# Patient Record
Sex: Female | Born: 1950 | Race: Black or African American | Hispanic: No | Marital: Married | Smoking: Former smoker
Health system: Southern US, Community
[De-identification: ages and names within clinical notes are randomized; demographics above are authoritative.]

## PROBLEM LIST (undated history)

## (undated) DIAGNOSIS — D649 Anemia, unspecified: Secondary | ICD-10-CM

## (undated) DIAGNOSIS — M1990 Unspecified osteoarthritis, unspecified site: Secondary | ICD-10-CM

## (undated) DIAGNOSIS — A159 Respiratory tuberculosis unspecified: Secondary | ICD-10-CM

## (undated) DIAGNOSIS — R112 Nausea with vomiting, unspecified: Secondary | ICD-10-CM

## (undated) DIAGNOSIS — F32A Depression, unspecified: Secondary | ICD-10-CM

## (undated) DIAGNOSIS — F419 Anxiety disorder, unspecified: Secondary | ICD-10-CM

## (undated) DIAGNOSIS — Z9889 Other specified postprocedural states: Secondary | ICD-10-CM

## (undated) DIAGNOSIS — K219 Gastro-esophageal reflux disease without esophagitis: Secondary | ICD-10-CM

## (undated) DIAGNOSIS — I10 Essential (primary) hypertension: Secondary | ICD-10-CM

## (undated) HISTORY — PX: APPENDECTOMY: SHX54

## (undated) HISTORY — PX: WISDOM TOOTH EXTRACTION: SHX21

## (undated) HISTORY — PX: LAMINECTOMY: SHX219

## (undated) HISTORY — PX: ABDOMINAL HYSTERECTOMY: SHX81

## (undated) HISTORY — PX: TONSILLECTOMY: SUR1361

## (undated) HISTORY — PX: ROTATOR CUFF REPAIR: SHX139

---

## 2011-10-26 ENCOUNTER — Other Ambulatory Visit: Payer: Self-pay | Admitting: Orthopaedic Surgery

## 2011-10-26 DIAGNOSIS — M25511 Pain in right shoulder: Secondary | ICD-10-CM

## 2011-11-03 ENCOUNTER — Ambulatory Visit
Admission: RE | Admit: 2011-11-03 | Discharge: 2011-11-03 | Disposition: A | Discharge status: Home / Self Care | Payer: Medicare PPO | Source: Ambulatory Visit | Attending: Orthopaedic Surgery | Admitting: Orthopaedic Surgery

## 2011-11-03 DIAGNOSIS — M25511 Pain in right shoulder: Secondary | ICD-10-CM

## 2016-07-14 ENCOUNTER — Other Ambulatory Visit: Payer: Self-pay | Admitting: Orthopedic Surgery

## 2016-07-14 DIAGNOSIS — M542 Cervicalgia: Secondary | ICD-10-CM

## 2016-07-14 DIAGNOSIS — R200 Anesthesia of skin: Secondary | ICD-10-CM

## 2016-07-22 ENCOUNTER — Other Ambulatory Visit: Payer: Medicare PPO

## 2016-11-14 ENCOUNTER — Other Ambulatory Visit: Payer: Self-pay | Admitting: Physical Medicine and Rehabilitation

## 2016-11-14 DIAGNOSIS — M545 Low back pain: Secondary | ICD-10-CM

## 2016-12-02 ENCOUNTER — Ambulatory Visit
Admission: RE | Admit: 2016-12-02 | Discharge: 2016-12-02 | Disposition: A | Discharge status: Home / Self Care | Payer: Medicare PPO | Source: Ambulatory Visit | Attending: Physical Medicine and Rehabilitation | Admitting: Physical Medicine and Rehabilitation

## 2016-12-02 ENCOUNTER — Encounter: Payer: Self-pay | Admitting: Radiology

## 2016-12-02 DIAGNOSIS — M545 Low back pain: Secondary | ICD-10-CM

## 2021-04-23 NOTE — Progress Notes (Signed)
Sent message, via epic in basket, requesting orders in epic from surgeon.  

## 2021-04-30 NOTE — Progress Notes (Addendum)
COVID swab appointment: n/a  COVID Vaccine Completed: yes x3 Date COVID Vaccine completed: Has received booster: COVID vaccine manufacturer: Pfizer    [employer]   Date of COVID positive in last 90 days: no  PCP - Dr. Lorelei Pont, [facility] Cardiologist - n/a  Chest x-ray - n/a EKG - October or nov requested Stress Test - n/a ECHO - 2017, negative- Care Everywhere Cardiac Cath - n/a Pacemaker/ICD device last checked: n/a Spinal Cord Stimulator: n/a  Sleep Study - n/a CPAP -   Fasting Blood Sugar - n/a Checks Blood Sugar _____ times a day  Blood Thinner Instructions: n/a Aspirin Instructions: Last Dose:  Activity level: Can go up a flight of stairs and perform activities of daily living without stopping and without symptoms of chest pain or shortness of breath. No stairs and very limited walking due to right hip and knee       Anesthesia review:   Patient denies shortness of breath, fever, cough and chest pain at PAT appointment   Patient verbalized understanding of instructions that were given to them at the PAT appointment. Patient was also instructed that they will need to review over the PAT instructions again at home before surgery.

## 2021-04-30 NOTE — Patient Instructions (Addendum)
DUE TO COVID-19 ONLY ONE VISITOR IS ALLOWED TO COME WITH YOU AND STAY IN THE WAITING ROOM ONLY DURING PRE OP AND PROCEDURE.   **NO VISITORS ARE ALLOWED IN THE SHORT STAY AREA OR RECOVERY ROOM!!**       Your procedure is scheduled on: 05/14/21   Report to [facility] Main Entrance    Report to admitting at 7:30 AM   Call this number if you have problems the morning of surgery [phone]   Do not eat food :After Midnight.   May have liquids until 7:15 AM day of surgery  CLEAR LIQUID DIET  Foods Allowed                                                                     Foods Excluded  Water, Black Coffee and tea (no milk or creamer)           liquids that you cannot  Plain Jell-O in any flavor  (No red)                                    see through such as: Fruit ices (not with fruit pulp)                                            milk, soups, orange juice              Iced Popsicles (No red)                                                All solid food                                   Apple juices Sports drinks like Gatorade (No red) Lightly seasoned clear broth or consume(fat free) Sugar    Oral Hygiene is also important to reduce your risk of infection.                                    Remember - BRUSH YOUR TEETH THE MORNING OF SURGERY WITH YOUR REGULAR TOOTHPASTE   Take these medicines the morning of surgery with A SIP OF WATER: Tylenol, Amlodipine, Lipitor, Cymbalta, Nexium, Gabapentin, Claritin, Nebivolol  DO NOT TAKE ANY ORAL DIABETIC MEDICATIONS DAY OF YOUR SURGERY                              You may not have any metal on your body including hair pins, jewelry, and body piercing             Do not wear make-up, lotions, powders, perfumes, or deodorant  Do not wear nail polish including gel and S&S, artificial/acrylic nails, or any other type of covering  on natural nails including finger and toenails. If you have artificial nails, gel coating, etc. that  needs to be removed by a nail salon please have this removed prior to surgery or surgery may need to be canceled/ delayed if the surgeon/ anesthesia feels like they are unable to be safely monitored.   Do not shave  48 hours prior to surgery.    Do not bring valuables to the hospital. [facility] IS NOT             RESPONSIBLE   FOR VALUABLES.   Contacts, dentures or bridgework may not be worn into surgery.    Patients discharged on the day of surgery will not be allowed to drive home.   Special Instructions: Bring a copy of your healthcare power of attorney and living will documents         the day of surgery if you haven't scanned them before.              Please read over the following fact sheets you were given: IF YOU HAVE QUESTIONS ABOUT YOUR PRE-OP INSTRUCTIONS PLEASE CALL [phone]- [facility] - Preparing for Surgery Before surgery, you can play an important role.  Because skin is not sterile, your skin needs to be as free of germs as possible.  You can reduce the number of germs on your skin by washing with CHG (chlorahexidine gluconate) soap before surgery.  CHG is an antiseptic cleaner which kills germs and bonds with the skin to continue killing germs even after washing. Please DO NOT use if you have an allergy to CHG or antibacterial soaps.  If your skin becomes reddened/irritated stop using the CHG and inform your nurse when you arrive at Short Stay. Do not shave (including legs and underarms) for at least 48 hours prior to the first CHG shower.  You may shave your face/neck.  Please follow these instructions carefully:  1.  Shower with CHG Soap the night before surgery and the  morning of surgery.  2.  If you choose to wash your hair, wash your hair first as usual with your normal  shampoo.  3.  After you shampoo, rinse your hair and body thoroughly to remove the shampoo.                             4.  Use CHG as you would any other liquid soap.  You can apply chg  directly to the skin and wash.  Gently with a scrungie or clean washcloth.  5.  Apply the CHG Soap to your body ONLY FROM THE NECK DOWN.   Do   not use on face/ open                           Wound or open sores. Avoid contact with eyes, ears mouth and   genitals (private parts).                       Wash face,  Genitals (private parts) with your normal soap.             6.  Wash thoroughly, paying special attention to the area where your    surgery  will be performed.  7.  Thoroughly rinse your body with warm water from the neck down.  8.  DO NOT shower/wash with your normal soap  after using and rinsing off the CHG Soap.                9.  Pat yourself dry with a clean towel.            10.  Wear clean pajamas.            11.  Place clean sheets on your bed the night of your first shower and do not  sleep with pets. Day of Surgery : Do not apply any lotions/deodorants the morning of surgery.  Please wear clean clothes to the hospital/surgery center.  FAILURE TO FOLLOW THESE INSTRUCTIONS MAY RESULT IN THE CANCELLATION OF YOUR SURGERY  PATIENT SIGNATURE_________________________________  NURSE SIGNATURE__________________________________  ________________________________________________________________________

## 2021-05-03 ENCOUNTER — Other Ambulatory Visit: Payer: Self-pay

## 2021-05-03 ENCOUNTER — Encounter (HOSPITAL_COMMUNITY): Payer: Self-pay

## 2021-05-03 ENCOUNTER — Encounter (HOSPITAL_COMMUNITY)
Admission: RE | Admit: 2021-05-03 | Discharge: 2021-05-03 | Disposition: A | Discharge status: Home / Self Care | Payer: Medicare PPO | Source: Ambulatory Visit | Attending: Orthopedic Surgery | Admitting: Orthopedic Surgery

## 2021-05-03 ENCOUNTER — Ambulatory Visit: Payer: Self-pay | Admitting: Physician Assistant

## 2021-05-03 VITALS — BP 109/76 | HR 87 | Temp 98.4°F | Resp 18 | Ht 71.0 in | Wt 228.0 lb

## 2021-05-03 DIAGNOSIS — M1611 Unilateral primary osteoarthritis, right hip: Secondary | ICD-10-CM | POA: Insufficient documentation

## 2021-05-03 DIAGNOSIS — M25552 Pain in left hip: Secondary | ICD-10-CM

## 2021-05-03 DIAGNOSIS — G8929 Other chronic pain: Secondary | ICD-10-CM | POA: Insufficient documentation

## 2021-05-03 DIAGNOSIS — M25551 Pain in right hip: Secondary | ICD-10-CM

## 2021-05-03 DIAGNOSIS — I2510 Atherosclerotic heart disease of native coronary artery without angina pectoris: Secondary | ICD-10-CM | POA: Insufficient documentation

## 2021-05-03 DIAGNOSIS — Z01812 Encounter for preprocedural laboratory examination: Secondary | ICD-10-CM | POA: Insufficient documentation

## 2021-05-03 DIAGNOSIS — Z01818 Encounter for other preprocedural examination: Secondary | ICD-10-CM

## 2021-05-03 DIAGNOSIS — M1612 Unilateral primary osteoarthritis, left hip: Secondary | ICD-10-CM

## 2021-05-03 HISTORY — DX: Respiratory tuberculosis unspecified: A15.9

## 2021-05-03 HISTORY — DX: Depression, unspecified: F32.A

## 2021-05-03 HISTORY — DX: Essential (primary) hypertension: I10

## 2021-05-03 HISTORY — DX: Anxiety disorder, unspecified: F41.9

## 2021-05-03 HISTORY — DX: Gastro-esophageal reflux disease without esophagitis: K21.9

## 2021-05-03 HISTORY — DX: Anemia, unspecified: D64.9

## 2021-05-03 HISTORY — DX: Unspecified osteoarthritis, unspecified site: M19.90

## 2021-05-03 LAB — CBC
HCT: 35.6 % — ABNORMAL LOW (ref 36.0–46.0)
Hemoglobin: 11.5 g/dL — ABNORMAL LOW (ref 12.0–15.0)
MCH: 28.4 pg (ref 26.0–34.0)
MCHC: 32.3 g/dL (ref 30.0–36.0)
MCV: 87.9 fL (ref 80.0–100.0)
Platelets: 432 10*3/uL — ABNORMAL HIGH (ref 150–400)
RBC: 4.05 MIL/uL (ref 3.87–5.11)
RDW: 14.6 % (ref 11.5–15.5)
WBC: 9.4 10*3/uL (ref 4.0–10.5)
nRBC: 0 % (ref 0.0–0.2)

## 2021-05-03 LAB — BASIC METABOLIC PANEL
Anion gap: 13 (ref 5–15)
BUN: 14 mg/dL (ref 8–23)
CO2: 28 mmol/L (ref 22–32)
Calcium: 8.8 mg/dL — ABNORMAL LOW (ref 8.9–10.3)
Chloride: 98 mmol/L (ref 98–111)
Creatinine, Ser: 0.81 mg/dL (ref 0.44–1.00)
GFR, Estimated: >60 mL/min (ref >60)
Glucose, Bld: 109 mg/dL — ABNORMAL HIGH (ref 70–99)
Potassium: 3.1 mmol/L — ABNORMAL LOW (ref 3.5–5.1)
Sodium: 139 mmol/L (ref 135–145)

## 2021-05-03 LAB — SURGICAL PCR SCREEN
MRSA, PCR: NEGATIVE
Staphylococcus aureus: POSITIVE — AB

## 2021-05-03 NOTE — H&P (Signed)
TOTAL HIP ADMISSION H&P ° °Patient is admitted for right total hip arthroplasty. ° °Subjective: ° °Chief Complaint: right hip pain ° °HPI: Jeanette Bennett, 70 y.o. female, has a history of pain and functional disability in the right hip(s) due to arthritis and patient has failed non-surgical conservative treatments for greater than 12 weeks to include NSAID's and/or analgesics, corticosteriod injections, and activity modification.  Onset of symptoms was gradual starting 5 years ago with gradually worsening course since that time.The patient noted no past surgery on the right hip(s).  Patient currently rates pain in the right hip at 8 out of 10 with activity. Patient has night pain, worsening of pain with activity and weight bearing, trendelenberg gait, pain that interfers with activities of daily living, and pain with passive range of motion. Patient has evidence of periarticular osteophytes and joint space narrowing by imaging studies. This condition presents safety issues increasing the risk of falls. This patient has had    .  There is no current active infection. ° °Patient Active Problem List  ° Diagnosis Date Noted  ° Chronic right hip pain 05/03/2021  ° Primary localized osteoarthritis of right hip 05/03/2021  ° °Past Medical History:  °Diagnosis Date  ° Anemia   ° Anxiety   ° Arthritis   ° Depression   ° GERD (gastroesophageal reflux disease)   ° Hypertension   ° Tuberculosis   °  °Past Surgical History:  °Procedure Laterality Date  ° ABDOMINAL HYSTERECTOMY    ° APPENDECTOMY    ° LAMINECTOMY    ° ROTATOR CUFF REPAIR Right   ° TONSILLECTOMY    ° WISDOM TOOTH EXTRACTION    °  °Current Outpatient Medications  °Medication Sig Dispense Refill Last Dose  ° acetaminophen (TYLENOL) 650 MG CR tablet Take 1,300 mg by mouth every 8 (eight) hours as needed for pain.     ° alendronate (FOSAMAX) 70 MG tablet Take 70 mg by mouth every Monday. Take with a full glass of water on an empty stomach.     ° amLODipine  (NORVASC) 10 MG tablet Take 10 mg by mouth daily.     ° atorvastatin (LIPITOR) 80 MG tablet Take 40 mg by mouth daily.     ° calcium carbonate (TUMS - DOSED IN MG ELEMENTAL CALCIUM) 500 MG chewable tablet Chew 1,500 mg by mouth 3 (three) times daily as needed for indigestion or heartburn.     ° colchicine 0.6 MG tablet Take 0.6-1.2 mg by mouth See admin instructions. Take 1.2 mg at onset of gout flare then take 0.6 mg 1 hour later as needed for gout     ° diclofenac Sodium (VOLTAREN) 1 % GEL Apply 1 application topically 4 (four) times daily.     ° DULoxetine (CYMBALTA) 30 MG capsule Take 30 mg by mouth daily.     ° esomeprazole (NEXIUM) 40 MG capsule Take 40 mg by mouth daily.     ° gabapentin (NEURONTIN) 100 MG capsule Take 100 mg by mouth 2 (two) times daily.     ° loratadine (CLARITIN) 10 MG tablet Take 10 mg by mouth daily.     ° Multiple Vitamin (MULTIVITAMIN WITH MINERALS) TABS tablet Take 1 tablet by mouth daily.     ° Nebivolol HCl 20 MG TABS Take 20 mg by mouth daily.     ° potassium chloride (KLOR-CON M) 10 MEQ tablet Take 10 mEq by mouth daily.     ° triamterene-hydrochlorothiazide (MAXZIDE) 75-50 MG tablet Take 1 tablet   by mouth daily.     ° valsartan (DIOVAN) 160 MG tablet Take 160 mg by mouth daily.     ° Vitamin D, Ergocalciferol, (DRISDOL) 1.25 MG (50000 UNIT) CAPS capsule Take 50,000 Units by mouth every Monday.     ° °No current facility-administered medications for this visit.  ° °Allergies  °Allergen Reactions  ° Other Shortness Of Breath  °  Tree Nuts  ° Sulfa Antibiotics Nausea And Vomiting and Rash  °  °Social History  ° °Tobacco Use  ° Smoking status: Former  °  Types: Cigarettes  ° Smokeless tobacco: Never  °Substance Use Topics  ° Alcohol use: Yes  °  Comment: occ  °  °No family history on file.  ° °Review of Systems  °Respiratory:  Positive for cough.   °Gastrointestinal:  Positive for nausea and vomiting.  °Genitourinary:  Positive for frequency.  °Musculoskeletal:  Positive for  arthralgias.  °Hematological:  Bruises/bleeds easily.  °All other systems reviewed and are negative. ° °Objective: ° °Physical Exam °Constitutional:   °   General: She is not in acute distress. °   Appearance: Normal appearance.  °HENT:  °   Head: Normocephalic and atraumatic.  °Eyes:  °   Extraocular Movements: Extraocular movements intact.  °   Pupils: Pupils are equal, round, and reactive to light.  °Cardiovascular:  °   Rate and Rhythm: Regular rhythm. Tachycardia present.  °   Pulses: Normal pulses.  °   Heart sounds: Normal heart sounds.  °Pulmonary:  °   Effort: Pulmonary effort is normal. No respiratory distress.  °   Breath sounds: Normal breath sounds.  °Abdominal:  °   General: Abdomen is flat. Bowel sounds are normal. There is no distension.  °   Palpations: Abdomen is soft.  °   Tenderness: There is no abdominal tenderness.  °Musculoskeletal:  °   Cervical back: Normal range of motion and neck supple.  °   Comments: Examination of the right lower extremity again shows she is neurovascularly intact.  Intact dorsiflexion and plantarflexion with the ankle.  There is no calf tenderness to palpation.  Painful arc of motion and limited range of motion with the right hip passively.    °Lymphadenopathy:  °   Cervical: No cervical adenopathy.  °Skin: °   General: Skin is warm and dry.  °   Findings: No erythema or rash.  °Neurological:  °   General: No focal deficit present.  °   Mental Status: She is alert and oriented to person, place, and time.  °Psychiatric:     °   Mood and Affect: Mood normal.     °   Behavior: Behavior normal.  ° ° °Vital signs in last 24 hours: °@VSRANGES@ ° °Labs: ° ° °Estimated body mass index is 31.8 kg/m² as calculated from the following: °  Height as of an earlier encounter on 05/03/21: 5' 11" (1.803 m). °  Weight as of an earlier encounter on 05/03/21: 103.4 kg. ° ° °Imaging Review °Plain radiographs demonstrate moderate degenerative joint disease of the right hip(s). The bone  quality appears to be good for age and reported activity level. ° ° ° ° ° °Assessment/Plan: ° °End stage arthritis, right hip(s) ° °The patient history, physical examination, clinical judgement of the provider and imaging studies are consistent with end stage degenerative joint disease of the right hip(s) and total hip arthroplasty is deemed medically necessary. The treatment options including medical management, injection therapy, arthroscopy and   arthroplasty were discussed at length. The risks and benefits of total hip arthroplasty were presented and reviewed. The risks due to aseptic loosening, infection, stiffness, dislocation/subluxation,  thromboembolic complications and other imponderables were discussed.  The patient acknowledged the explanation, agreed to proceed with the plan and consent was signed. Patient is being admitted for inpatient treatment for surgery, pain control, PT, OT, prophylactic antibiotics, VTE prophylaxis, progressive ambulation and ADL's and discharge planning.The patient is planning to be discharged  home with outpt PT. ° ° °Anticipated LOS equal to or greater than 2 midnights due to °- Age 65 and older with one or more of the following: ° - Obesity ° - Expected need for hospital services (PT, OT, Nursing) required for safe  discharge ° - Anticipated need for postoperative skilled nursing care or inpatient rehab ° - Active co-morbidities: Diabetes °OR  ° °- Unanticipated findings during/Post Surgery: None  °- Patient is a high risk of re-admission due to: None °

## 2021-05-03 NOTE — H&P (View-Only) (Signed)
TOTAL HIP ADMISSION H&P  Patient is admitted for right total hip arthroplasty.  Subjective:  Chief Complaint: right hip pain  HPI: Jeanette Bennett [facility], 70 y.o. female, has a history of pain and functional disability in the right hip(s) due to arthritis and patient has failed non-surgical conservative treatments for greater than 12 weeks to include NSAID's and/or analgesics, corticosteriod injections, and activity modification.  Onset of symptoms was gradual starting 5 years ago with gradually worsening course since that time.The patient noted no past surgery on the right hip(s).  Patient currently rates pain in the right hip at 8 out of 10 with activity. Patient has night pain, worsening of pain with activity and weight bearing, trendelenberg gait, pain that interfers with activities of daily living, and pain with passive range of motion. Patient has evidence of periarticular osteophytes and joint space narrowing by imaging studies. This condition presents safety issues increasing the risk of falls. This patient has had    .  There is no current active infection.  Patient Active Problem List   Diagnosis Date Noted   Chronic right hip pain 05/03/2021   Primary localized osteoarthritis of right hip 05/03/2021   Past Medical History:  Diagnosis Date   Anemia    Anxiety    Arthritis    Depression    GERD (gastroesophageal reflux disease)    Hypertension    Tuberculosis     Past Surgical History:  Procedure Laterality Date   ABDOMINAL HYSTERECTOMY     APPENDECTOMY     LAMINECTOMY     ROTATOR CUFF REPAIR Right    TONSILLECTOMY     WISDOM TOOTH EXTRACTION      Current Outpatient Medications  Medication Sig Dispense Refill Last Dose   acetaminophen (TYLENOL) 650 MG CR tablet Take 1,300 mg by mouth every 8 (eight) hours as needed for pain.      alendronate (FOSAMAX) 70 MG tablet Take 70 mg by mouth every Monday. Take with a full glass of water on an empty stomach.      amLODipine  (NORVASC) 10 MG tablet Take 10 mg by mouth daily.      atorvastatin (LIPITOR) 80 MG tablet Take 40 mg by mouth daily.      calcium carbonate (TUMS - DOSED IN MG ELEMENTAL CALCIUM) 500 MG chewable tablet Chew 1,500 mg by mouth 3 (three) times daily as needed for indigestion or heartburn.      colchicine 0.6 MG tablet Take 0.6-1.2 mg by mouth See admin instructions. Take 1.2 mg at onset of gout flare then take 0.6 mg 1 hour later as needed for gout      diclofenac Sodium (VOLTAREN) 1 % GEL Apply 1 application topically 4 (four) times daily.      DULoxetine (CYMBALTA) 30 MG capsule Take 30 mg by mouth daily.      esomeprazole (NEXIUM) 40 MG capsule Take 40 mg by mouth daily.      gabapentin (NEURONTIN) 100 MG capsule Take 100 mg by mouth 2 (two) times daily.      loratadine (CLARITIN) 10 MG tablet Take 10 mg by mouth daily.      Multiple Vitamin (MULTIVITAMIN WITH MINERALS) TABS tablet Take 1 tablet by mouth daily.      Nebivolol HCl 20 MG TABS Take 20 mg by mouth daily.      potassium chloride (KLOR-CON M) 10 MEQ tablet Take 10 mEq by mouth daily.      triamterene-hydrochlorothiazide (MAXZIDE) 75-50 MG tablet Take 1 tablet  by mouth daily.      valsartan (DIOVAN) 160 MG tablet Take 160 mg by mouth daily.      Vitamin D, Ergocalciferol, (DRISDOL) 1.25 MG (50000 UNIT) CAPS capsule Take 50,000 Units by mouth every Monday.      No current facility-administered medications for this visit.   Allergies  Allergen Reactions   Other Shortness Of Breath    Tree Nuts   Sulfa Antibiotics Nausea And Vomiting and Rash    Social History   Tobacco Use   Smoking status: Former    Types: Cigarettes   Smokeless tobacco: Never  Substance Use Topics   Alcohol use: Yes    Comment: occ    No family history on file.   Review of Systems  Respiratory:  Positive for cough.   Gastrointestinal:  Positive for nausea and vomiting.  Genitourinary:  Positive for frequency.  Musculoskeletal:  Positive for  arthralgias.  Hematological:  Bruises/bleeds easily.  All other systems reviewed and are negative.  Objective:  Physical Exam Constitutional:      General: She is not in acute distress.    Appearance: Normal appearance.  HENT:     Head: Normocephalic and atraumatic.  Eyes:     Extraocular Movements: Extraocular movements intact.     Pupils: Pupils are equal, round, and reactive to light.  Cardiovascular:     Rate and Rhythm: Regular rhythm. Tachycardia present.     Pulses: Normal pulses.     Heart sounds: Normal heart sounds.  Pulmonary:     Effort: Pulmonary effort is normal. No respiratory distress.     Breath sounds: Normal breath sounds.  Abdominal:     General: Abdomen is flat. Bowel sounds are normal. There is no distension.     Palpations: Abdomen is soft.     Tenderness: There is no abdominal tenderness.  Musculoskeletal:     Cervical back: Normal range of motion and neck supple.     Comments: Examination of the right lower extremity again shows she is neurovascularly intact.  Intact dorsiflexion and plantarflexion with the ankle.  There is no calf tenderness to palpation.  Painful arc of motion and limited range of motion with the right hip passively.    Lymphadenopathy:     Cervical: No cervical adenopathy.  Skin:    General: Skin is warm and dry.     Findings: No erythema or rash.  Neurological:     General: No focal deficit present.     Mental Status: She is alert and oriented to person, place, and time.  Psychiatric:        Mood and Affect: Mood normal.        Behavior: Behavior normal.    Vital signs in last 24 hours: @VSRANGES @  Labs:   Estimated body mass index is 31.8 kg/m as calculated from the following:   Height as of an earlier encounter on 05/03/21: 5\' 11"  (1.803 m).   Weight as of an earlier encounter on 05/03/21: 103.4 kg.   Imaging Review Plain radiographs demonstrate moderate degenerative joint disease of the right hip(s). The bone  quality appears to be good for age and reported activity level.      Assessment/Plan:  End stage arthritis, right hip(s)  The patient history, physical examination, clinical judgement of the provider and imaging studies are consistent with end stage degenerative joint disease of the right hip(s) and total hip arthroplasty is deemed medically necessary. The treatment options including medical management, injection therapy, arthroscopy and  arthroplasty were discussed at length. The risks and benefits of total hip arthroplasty were presented and reviewed. The risks due to aseptic loosening, infection, stiffness, dislocation/subluxation,  thromboembolic complications and other imponderables were discussed.  The patient acknowledged the explanation, agreed to proceed with the plan and consent was signed. Patient is being admitted for inpatient treatment for surgery, pain control, PT, OT, prophylactic antibiotics, VTE prophylaxis, progressive ambulation and ADL's and discharge planning.The patient is planning to be discharged  home with outpt PT.   Anticipated LOS equal to or greater than 2 midnights due to - Age 2 and older with one or more of the following:  - Obesity  - Expected need for hospital services (PT, OT, Nursing) required for safe  discharge  - Anticipated need for postoperative skilled nursing care or inpatient rehab  - Active co-morbidities: Diabetes OR   - Unanticipated findings during/Post Surgery: None  - Patient is a high risk of re-admission due to: None

## 2021-05-14 ENCOUNTER — Ambulatory Visit (HOSPITAL_COMMUNITY): Payer: Medicare PPO

## 2021-05-14 ENCOUNTER — Encounter (HOSPITAL_COMMUNITY): Payer: Self-pay | Admitting: Orthopedic Surgery

## 2021-05-14 ENCOUNTER — Encounter (HOSPITAL_COMMUNITY)
Admission: RE | Disposition: A | Discharge status: Skilled Nursing Facility | Payer: Self-pay | Source: Home / Self Care | Attending: Internal Medicine

## 2021-05-14 ENCOUNTER — Inpatient Hospital Stay (HOSPITAL_COMMUNITY)
Admission: RE | Admit: 2021-05-14 | Discharge: 2021-05-23 | DRG: 469 | Disposition: A | Discharge status: Skilled Nursing Facility | Payer: Medicare PPO | Attending: Internal Medicine | Admitting: Internal Medicine

## 2021-05-14 ENCOUNTER — Ambulatory Visit (HOSPITAL_COMMUNITY): Payer: Medicare PPO | Admitting: Anesthesiology

## 2021-05-14 ENCOUNTER — Other Ambulatory Visit: Payer: Self-pay

## 2021-05-14 DIAGNOSIS — M109 Gout, unspecified: Secondary | ICD-10-CM | POA: Diagnosis present

## 2021-05-14 DIAGNOSIS — I9581 Postprocedural hypotension: Secondary | ICD-10-CM | POA: Diagnosis not present

## 2021-05-14 DIAGNOSIS — Z6831 Body mass index (BMI) 31.0-31.9, adult: Secondary | ICD-10-CM

## 2021-05-14 DIAGNOSIS — E785 Hyperlipidemia, unspecified: Secondary | ICD-10-CM | POA: Diagnosis present

## 2021-05-14 DIAGNOSIS — Z7983 Long term (current) use of bisphosphonates: Secondary | ICD-10-CM

## 2021-05-14 DIAGNOSIS — E119 Type 2 diabetes mellitus without complications: Secondary | ICD-10-CM | POA: Diagnosis present

## 2021-05-14 DIAGNOSIS — G9340 Encephalopathy, unspecified: Secondary | ICD-10-CM | POA: Diagnosis present

## 2021-05-14 DIAGNOSIS — M1611 Unilateral primary osteoarthritis, right hip: Principal | ICD-10-CM | POA: Diagnosis present

## 2021-05-14 DIAGNOSIS — Z8611 Personal history of tuberculosis: Secondary | ICD-10-CM

## 2021-05-14 DIAGNOSIS — F32A Depression, unspecified: Secondary | ICD-10-CM | POA: Diagnosis present

## 2021-05-14 DIAGNOSIS — K219 Gastro-esophageal reflux disease without esophagitis: Secondary | ICD-10-CM | POA: Diagnosis present

## 2021-05-14 DIAGNOSIS — J9601 Acute respiratory failure with hypoxia: Secondary | ICD-10-CM | POA: Diagnosis not present

## 2021-05-14 DIAGNOSIS — Z90710 Acquired absence of both cervix and uterus: Secondary | ICD-10-CM

## 2021-05-14 DIAGNOSIS — S01512A Laceration without foreign body of oral cavity, initial encounter: Secondary | ICD-10-CM | POA: Diagnosis not present

## 2021-05-14 DIAGNOSIS — G4733 Obstructive sleep apnea (adult) (pediatric): Secondary | ICD-10-CM | POA: Diagnosis present

## 2021-05-14 DIAGNOSIS — T40605A Adverse effect of unspecified narcotics, initial encounter: Secondary | ICD-10-CM | POA: Diagnosis not present

## 2021-05-14 DIAGNOSIS — Z7982 Long term (current) use of aspirin: Secondary | ICD-10-CM

## 2021-05-14 DIAGNOSIS — K5900 Constipation, unspecified: Secondary | ICD-10-CM | POA: Diagnosis not present

## 2021-05-14 DIAGNOSIS — Z9109 Other allergy status, other than to drugs and biological substances: Secondary | ICD-10-CM

## 2021-05-14 DIAGNOSIS — I10 Essential (primary) hypertension: Secondary | ICD-10-CM | POA: Diagnosis present

## 2021-05-14 DIAGNOSIS — G8929 Other chronic pain: Secondary | ICD-10-CM | POA: Diagnosis present

## 2021-05-14 DIAGNOSIS — Z993 Dependence on wheelchair: Secondary | ICD-10-CM

## 2021-05-14 DIAGNOSIS — I700 Atherosclerosis of aorta: Secondary | ICD-10-CM | POA: Diagnosis present

## 2021-05-14 DIAGNOSIS — R7401 Elevation of levels of liver transaminase levels: Secondary | ICD-10-CM | POA: Diagnosis not present

## 2021-05-14 DIAGNOSIS — Z882 Allergy status to sulfonamides status: Secondary | ICD-10-CM

## 2021-05-14 DIAGNOSIS — M25551 Pain in right hip: Secondary | ICD-10-CM

## 2021-05-14 DIAGNOSIS — Z9181 History of falling: Secondary | ICD-10-CM

## 2021-05-14 DIAGNOSIS — E669 Obesity, unspecified: Secondary | ICD-10-CM | POA: Diagnosis present

## 2021-05-14 DIAGNOSIS — Z79899 Other long term (current) drug therapy: Secondary | ICD-10-CM

## 2021-05-14 DIAGNOSIS — F419 Anxiety disorder, unspecified: Secondary | ICD-10-CM | POA: Diagnosis present

## 2021-05-14 DIAGNOSIS — R4701 Aphasia: Secondary | ICD-10-CM | POA: Diagnosis not present

## 2021-05-14 DIAGNOSIS — Z87891 Personal history of nicotine dependence: Secondary | ICD-10-CM

## 2021-05-14 DIAGNOSIS — Z91018 Allergy to other foods: Secondary | ICD-10-CM

## 2021-05-14 DIAGNOSIS — G9341 Metabolic encephalopathy: Secondary | ICD-10-CM | POA: Diagnosis not present

## 2021-05-14 DIAGNOSIS — D62 Acute posthemorrhagic anemia: Secondary | ICD-10-CM | POA: Diagnosis not present

## 2021-05-14 DIAGNOSIS — Z20822 Contact with and (suspected) exposure to covid-19: Secondary | ICD-10-CM | POA: Diagnosis present

## 2021-05-14 HISTORY — PX: TOTAL HIP ARTHROPLASTY: SHX124

## 2021-05-14 LAB — ABO/RH: ABO/RH(D): A POS

## 2021-05-14 LAB — TYPE AND SCREEN
ABO/RH(D): A POS
Antibody Screen: NEGATIVE

## 2021-05-14 SURGERY — ARTHROPLASTY, HIP, TOTAL,POSTERIOR APPROACH
Anesthesia: Spinal | Site: Hip | Laterality: Right

## 2021-05-14 MED ORDER — OXYCODONE HCL 5 MG PO TABS
ORAL_TABLET | ORAL | Status: AC
  Filled 2021-05-14: qty 2

## 2021-05-14 MED ORDER — TRANEXAMIC ACID 1000 MG/10ML IV SOLN
2000.0000 mg | INTRAVENOUS | Status: DC
  Filled 2021-05-14: qty 20

## 2021-05-14 MED ORDER — POLYETHYLENE GLYCOL 3350 17 G PO PACK
17.0000 g | PACK | Freq: Every day | ORAL | Status: DC | PRN
  Administered 2021-05-21: 08:00:00 17 g via ORAL
  Filled 2021-05-14: qty 1

## 2021-05-14 MED ORDER — WATER FOR IRRIGATION, STERILE IR SOLN
Status: DC | PRN
  Administered 2021-05-14: 2000 mL

## 2021-05-14 MED ORDER — DULOXETINE HCL 30 MG PO CPEP
30.0000 mg | ORAL_CAPSULE | Freq: Every day | ORAL | Status: DC
  Administered 2021-05-14 – 2021-05-17 (×4): 30 mg via ORAL
  Filled 2021-05-14 (×4): qty 1

## 2021-05-14 MED ORDER — BUPIVACAINE LIPOSOME 1.3 % IJ SUSP
INTRAMUSCULAR | Status: DC | PRN
  Administered 2021-05-14: 10 mL

## 2021-05-14 MED ORDER — ASPIRIN 81 MG PO CHEW
81.0000 mg | CHEWABLE_TABLET | Freq: Two times a day (BID) | ORAL | Status: DC
  Administered 2021-05-15 – 2021-05-23 (×16): 81 mg via ORAL
  Filled 2021-05-14 (×16): qty 1

## 2021-05-14 MED ORDER — HYDROMORPHONE HCL 1 MG/ML IJ SOLN
0.5000 mg | INTRAMUSCULAR | Status: DC | PRN
  Administered 2021-05-14 – 2021-05-16 (×2): 1 mg via INTRAVENOUS
  Filled 2021-05-14 (×2): qty 1

## 2021-05-14 MED ORDER — POTASSIUM CHLORIDE CRYS ER 10 MEQ PO TBCR
10.0000 meq | EXTENDED_RELEASE_TABLET | Freq: Every day | ORAL | Status: DC
  Administered 2021-05-14 – 2021-05-23 (×10): 10 meq via ORAL
  Filled 2021-05-14 (×10): qty 1

## 2021-05-14 MED ORDER — BUPIVACAINE LIPOSOME 1.3 % IJ SUSP: 10.0000 mL | Freq: Once | INTRAMUSCULAR | Status: DC

## 2021-05-14 MED ORDER — NEBIVOLOL HCL 10 MG PO TABS
20.0000 mg | ORAL_TABLET | Freq: Once | ORAL | Status: AC
  Administered 2021-05-14: 09:00:00 20 mg via ORAL
  Filled 2021-05-14: qty 2

## 2021-05-14 MED ORDER — ONDANSETRON HCL 4 MG PO TABS
4.0000 mg | ORAL_TABLET | Freq: Four times a day (QID) | ORAL | Status: DC | PRN
  Administered 2021-05-20: 12:00:00 4 mg via ORAL
  Filled 2021-05-14: qty 1

## 2021-05-14 MED ORDER — SODIUM CHLORIDE (PF) 0.9 % IJ SOLN
INTRAMUSCULAR | Status: DC | PRN
  Administered 2021-05-14: 20 mL

## 2021-05-14 MED ORDER — LIDOCAINE 2% (20 MG/ML) 5 ML SYRINGE
INTRAMUSCULAR | Status: DC | PRN
  Administered 2021-05-14: 60 mg via INTRAVENOUS

## 2021-05-14 MED ORDER — PHENYLEPHRINE 40 MCG/ML (10ML) SYRINGE FOR IV PUSH (FOR BLOOD PRESSURE SUPPORT)
PREFILLED_SYRINGE | INTRAVENOUS | Status: DC | PRN
  Administered 2021-05-14: 120 ug via INTRAVENOUS

## 2021-05-14 MED ORDER — FENTANYL CITRATE (PF) 100 MCG/2ML IJ SOLN
INTRAMUSCULAR | Status: AC
  Filled 2021-05-14: qty 2

## 2021-05-14 MED ORDER — HYDROMORPHONE HCL 1 MG/ML IJ SOLN: 0.2500 mg | INTRAMUSCULAR | Status: DC | PRN

## 2021-05-14 MED ORDER — CEFAZOLIN SODIUM-DEXTROSE 2-4 GM/100ML-% IV SOLN
2.0000 g | INTRAVENOUS | Status: AC
  Administered 2021-05-14: 10:00:00 2 g via INTRAVENOUS
  Filled 2021-05-14: qty 100

## 2021-05-14 MED ORDER — DOCUSATE SODIUM 100 MG PO CAPS
100.0000 mg | ORAL_CAPSULE | Freq: Two times a day (BID) | ORAL | Status: DC
  Administered 2021-05-14 – 2021-05-23 (×17): 100 mg via ORAL
  Filled 2021-05-14 (×17): qty 1

## 2021-05-14 MED ORDER — TRANEXAMIC ACID 1000 MG/10ML IV SOLN
INTRAVENOUS | Status: DC | PRN
  Administered 2021-05-14: 11:00:00 2000 mg via TOPICAL

## 2021-05-14 MED ORDER — MIDAZOLAM HCL 2 MG/2ML IJ SOLN
INTRAMUSCULAR | Status: DC | PRN
  Administered 2021-05-14: 2 mg via INTRAVENOUS

## 2021-05-14 MED ORDER — ASPIRIN EC 81 MG PO TBEC: 81.0000 mg | DELAYED_RELEASE_TABLET | Freq: Two times a day (BID) | ORAL | 0 refills | Status: AC

## 2021-05-14 MED ORDER — MENTHOL 3 MG MT LOZG
1.0000 | LOZENGE | OROMUCOSAL | Status: DC | PRN
  Filled 2021-05-14: qty 9

## 2021-05-14 MED ORDER — CHLORHEXIDINE GLUCONATE 0.12 % MT SOLN
15.0000 mL | Freq: Once | OROMUCOSAL | Status: AC
  Administered 2021-05-14: 08:00:00 15 mL via OROMUCOSAL

## 2021-05-14 MED ORDER — DIPHENHYDRAMINE HCL 12.5 MG/5ML PO ELIX: 12.5000 mg | ORAL_SOLUTION | ORAL | Status: DC | PRN

## 2021-05-14 MED ORDER — PANTOPRAZOLE SODIUM 40 MG PO TBEC
80.0000 mg | DELAYED_RELEASE_TABLET | Freq: Every day | ORAL | Status: DC
  Administered 2021-05-15 – 2021-05-23 (×8): 80 mg via ORAL
  Filled 2021-05-14 (×10): qty 2

## 2021-05-14 MED ORDER — ORAL CARE MOUTH RINSE: 15.0000 mL | Freq: Once | OROMUCOSAL | Status: AC

## 2021-05-14 MED ORDER — TRIAMTERENE-HCTZ 75-50 MG PO TABS
1.0000 | ORAL_TABLET | Freq: Every day | ORAL | Status: DC
  Administered 2021-05-14: 19:00:00 1 via ORAL
  Filled 2021-05-14 (×3): qty 1

## 2021-05-14 MED ORDER — DOCUSATE SODIUM 100 MG PO CAPS: 100.0000 mg | ORAL_CAPSULE | Freq: Every day | ORAL | 2 refills | Status: DC | PRN

## 2021-05-14 MED ORDER — FENTANYL CITRATE (PF) 100 MCG/2ML IJ SOLN
INTRAMUSCULAR | Status: DC | PRN
  Administered 2021-05-14 (×2): 50 ug via INTRAVENOUS

## 2021-05-14 MED ORDER — ROCURONIUM BROMIDE 100 MG/10ML IV SOLN
INTRAVENOUS | Status: DC | PRN
  Administered 2021-05-14: 20 mg via INTRAVENOUS
  Administered 2021-05-14: 60 mg via INTRAVENOUS

## 2021-05-14 MED ORDER — LACTATED RINGERS IV SOLN: INTRAVENOUS | Status: DC

## 2021-05-14 MED ORDER — LIDOCAINE HCL (PF) 2 % IJ SOLN
INTRAMUSCULAR | Status: AC
  Filled 2021-05-14: qty 5

## 2021-05-14 MED ORDER — GABAPENTIN 100 MG PO CAPS
100.0000 mg | ORAL_CAPSULE | Freq: Two times a day (BID) | ORAL | Status: DC
  Administered 2021-05-14 – 2021-05-18 (×6): 100 mg via ORAL
  Filled 2021-05-14 (×7): qty 1

## 2021-05-14 MED ORDER — BUPIVACAINE LIPOSOME 1.3 % IJ SUSP
INTRAMUSCULAR | Status: AC
  Filled 2021-05-14: qty 20

## 2021-05-14 MED ORDER — POVIDONE-IODINE 10 % EX SWAB
2.0000 "application " | Freq: Once | CUTANEOUS | Status: AC
  Administered 2021-05-14: 2 via TOPICAL

## 2021-05-14 MED ORDER — SODIUM CHLORIDE 0.9 % IR SOLN
Status: DC | PRN
  Administered 2021-05-14: 1000 mL

## 2021-05-14 MED ORDER — IRBESARTAN 150 MG PO TABS
150.0000 mg | ORAL_TABLET | Freq: Every day | ORAL | Status: DC
  Administered 2021-05-14: 19:00:00 150 mg via ORAL
  Filled 2021-05-14 (×2): qty 1

## 2021-05-14 MED ORDER — TRANEXAMIC ACID-NACL 1000-0.7 MG/100ML-% IV SOLN
1000.0000 mg | INTRAVENOUS | Status: AC
  Administered 2021-05-14: 11:00:00 1000 mg via INTRAVENOUS
  Filled 2021-05-14: qty 100

## 2021-05-14 MED ORDER — ONDANSETRON HCL 4 MG/2ML IJ SOLN
4.0000 mg | Freq: Four times a day (QID) | INTRAMUSCULAR | Status: DC | PRN
  Administered 2021-05-22: 4 mg via INTRAVENOUS
  Filled 2021-05-14: qty 2

## 2021-05-14 MED ORDER — ATORVASTATIN CALCIUM 40 MG PO TABS
40.0000 mg | ORAL_TABLET | Freq: Every day | ORAL | Status: DC
  Administered 2021-05-14 – 2021-05-18 (×5): 40 mg via ORAL
  Filled 2021-05-14 (×5): qty 1

## 2021-05-14 MED ORDER — PROPOFOL 1000 MG/100ML IV EMUL
INTRAVENOUS | Status: AC
  Filled 2021-05-14: qty 200

## 2021-05-14 MED ORDER — METOCLOPRAMIDE HCL 5 MG PO TABS: 5.0000 mg | ORAL_TABLET | Freq: Three times a day (TID) | ORAL | Status: DC | PRN

## 2021-05-14 MED ORDER — PHENOL 1.4 % MT LIQD: 1.0000 | OROMUCOSAL | Status: DC | PRN

## 2021-05-14 MED ORDER — LACTATED RINGERS IV BOLUS
250.0000 mL | Freq: Once | INTRAVENOUS | Status: AC
  Administered 2021-05-14: 15:00:00 250 mL via INTRAVENOUS

## 2021-05-14 MED ORDER — PHENYLEPHRINE HCL-NACL 20-0.9 MG/250ML-% IV SOLN
INTRAVENOUS | Status: DC | PRN
  Administered 2021-05-14: 15 ug/min via INTRAVENOUS

## 2021-05-14 MED ORDER — CALCIUM CARBONATE ANTACID 500 MG PO CHEW
1500.0000 mg | CHEWABLE_TABLET | Freq: Three times a day (TID) | ORAL | Status: DC | PRN
  Administered 2021-05-20: 09:00:00 1500 mg via ORAL
  Filled 2021-05-14: qty 8

## 2021-05-14 MED ORDER — OXYCODONE HCL 5 MG PO TABS: ORAL_TABLET | ORAL | 0 refills | Status: DC

## 2021-05-14 MED ORDER — VANCOMYCIN HCL IN DEXTROSE 1-5 GM/200ML-% IV SOLN
INTRAVENOUS | Status: AC
  Filled 2021-05-14: qty 200

## 2021-05-14 MED ORDER — TRANEXAMIC ACID-NACL 1000-0.7 MG/100ML-% IV SOLN
1000.0000 mg | Freq: Once | INTRAVENOUS | Status: AC
  Administered 2021-05-14: 19:00:00 1000 mg via INTRAVENOUS
  Filled 2021-05-14: qty 100

## 2021-05-14 MED ORDER — DEXMEDETOMIDINE (PRECEDEX) IN NS 20 MCG/5ML (4 MCG/ML) IV SYRINGE
PREFILLED_SYRINGE | INTRAVENOUS | Status: AC
  Filled 2021-05-14: qty 5

## 2021-05-14 MED ORDER — VANCOMYCIN HCL 1000 MG IV SOLR
INTRAVENOUS | Status: DC | PRN
  Administered 2021-05-14: 11:00:00 1000 mg via INTRAVENOUS

## 2021-05-14 MED ORDER — PHENYLEPHRINE HCL (PRESSORS) 10 MG/ML IV SOLN
INTRAVENOUS | Status: AC
  Filled 2021-05-14: qty 1

## 2021-05-14 MED ORDER — MIDAZOLAM HCL 2 MG/2ML IJ SOLN
INTRAMUSCULAR | Status: AC
  Filled 2021-05-14: qty 2

## 2021-05-14 MED ORDER — PROPOFOL 10 MG/ML IV BOLUS
INTRAVENOUS | Status: AC
  Filled 2021-05-14: qty 20

## 2021-05-14 MED ORDER — ACETAMINOPHEN 325 MG PO TABS: 325.0000 mg | ORAL_TABLET | Freq: Four times a day (QID) | ORAL | Status: DC | PRN

## 2021-05-14 MED ORDER — BUPIVACAINE-EPINEPHRINE 0.25% -1:200000 IJ SOLN
INTRAMUSCULAR | Status: DC | PRN
  Administered 2021-05-14: 30 mL

## 2021-05-14 MED ORDER — ONDANSETRON HCL 4 MG/2ML IJ SOLN
INTRAMUSCULAR | Status: AC
  Filled 2021-05-14: qty 2

## 2021-05-14 MED ORDER — BUPIVACAINE LIPOSOME 1.3 % IJ SUSP
INTRAMUSCULAR | Status: AC
  Filled 2021-05-14: qty 10

## 2021-05-14 MED ORDER — OXYCODONE HCL 5 MG PO TABS
5.0000 mg | ORAL_TABLET | ORAL | Status: DC | PRN
  Administered 2021-05-14 – 2021-05-16 (×7): 10 mg via ORAL
  Filled 2021-05-14 (×6): qty 2

## 2021-05-14 MED ORDER — PROPOFOL 10 MG/ML IV BOLUS
INTRAVENOUS | Status: DC | PRN
  Administered 2021-05-14: 200 mg via INTRAVENOUS

## 2021-05-14 MED ORDER — FENTANYL CITRATE (PF) 250 MCG/5ML IJ SOLN
INTRAMUSCULAR | Status: DC | PRN
  Administered 2021-05-14 (×4): 50 ug via INTRAVENOUS

## 2021-05-14 MED ORDER — AMLODIPINE BESYLATE 10 MG PO TABS
10.0000 mg | ORAL_TABLET | Freq: Every day | ORAL | Status: DC
  Administered 2021-05-14: 19:00:00 10 mg via ORAL
  Filled 2021-05-14 (×2): qty 1

## 2021-05-14 MED ORDER — BUPIVACAINE-EPINEPHRINE (PF) 0.25% -1:200000 IJ SOLN
INTRAMUSCULAR | Status: AC
  Filled 2021-05-14: qty 30

## 2021-05-14 MED ORDER — SODIUM CHLORIDE (PF) 0.9 % IJ SOLN
INTRAMUSCULAR | Status: AC
  Filled 2021-05-14: qty 10

## 2021-05-14 MED ORDER — PHENYLEPHRINE 40 MCG/ML (10ML) SYRINGE FOR IV PUSH (FOR BLOOD PRESSURE SUPPORT)
PREFILLED_SYRINGE | INTRAVENOUS | Status: AC
  Filled 2021-05-14: qty 20

## 2021-05-14 MED ORDER — ACETAMINOPHEN 500 MG PO TABS
1000.0000 mg | ORAL_TABLET | Freq: Once | ORAL | Status: AC
  Administered 2021-05-14: 08:00:00 1000 mg via ORAL
  Filled 2021-05-14: qty 2

## 2021-05-14 MED ORDER — FLEET ENEMA 7-19 GM/118ML RE ENEM: 1.0000 | ENEMA | Freq: Once | RECTAL | Status: DC | PRN

## 2021-05-14 MED ORDER — ONDANSETRON HCL 4 MG/2ML IJ SOLN: 4.0000 mg | Freq: Once | INTRAMUSCULAR | Status: DC | PRN

## 2021-05-14 MED ORDER — LACTATED RINGERS IV BOLUS
500.0000 mL | Freq: Once | INTRAVENOUS | Status: AC
  Administered 2021-05-14: 14:00:00 500 mL via INTRAVENOUS

## 2021-05-14 MED ORDER — PROPOFOL 1000 MG/100ML IV EMUL
INTRAVENOUS | Status: AC
  Filled 2021-05-14: qty 100

## 2021-05-14 MED ORDER — SUGAMMADEX SODIUM 500 MG/5ML IV SOLN
INTRAVENOUS | Status: DC | PRN
  Administered 2021-05-14: 400 mg via INTRAVENOUS

## 2021-05-14 MED ORDER — SODIUM CHLORIDE 0.9 % IV SOLN: INTRAVENOUS | Status: DC

## 2021-05-14 MED ORDER — METOCLOPRAMIDE HCL 5 MG/ML IJ SOLN: 5.0000 mg | Freq: Three times a day (TID) | INTRAMUSCULAR | Status: DC | PRN

## 2021-05-14 MED ORDER — ACETAMINOPHEN 500 MG PO TABS
1000.0000 mg | ORAL_TABLET | Freq: Four times a day (QID) | ORAL | Status: AC
  Administered 2021-05-14 – 2021-05-15 (×4): 1000 mg via ORAL
  Filled 2021-05-14 (×4): qty 2

## 2021-05-14 MED ORDER — FENTANYL CITRATE (PF) 250 MCG/5ML IJ SOLN
INTRAMUSCULAR | Status: AC
  Filled 2021-05-14: qty 5

## 2021-05-14 MED ORDER — SORBITOL 70 % SOLN
30.0000 mL | Freq: Every day | Status: DC | PRN
  Administered 2021-05-18: 10:00:00 30 mL via ORAL
  Filled 2021-05-14 (×2): qty 30

## 2021-05-14 SURGICAL SUPPLY — 60 items
APL SKNCLS STERI-STRIP NONHPOA (GAUZE/BANDAGES/DRESSINGS) ×1
BAG COUNTER SPONGE SURGICOUNT (BAG) IMPLANT
BAG DECANTER FOR FLEXI CONT (MISCELLANEOUS) ×2 IMPLANT
BAG SPEC THK2 15X12 ZIP CLS (MISCELLANEOUS) ×1
BAG SPNG CNTER NS LX DISP (BAG)
BAG ZIPLOCK 12X15 (MISCELLANEOUS) ×2 IMPLANT
BENZOIN TINCTURE PRP APPL 2/3 (GAUZE/BANDAGES/DRESSINGS) ×2 IMPLANT
BLADE SAW SAG 73X25 THK (BLADE) ×1
BLADE SAW SGTL 73X25 THK (BLADE) ×1 IMPLANT
CLSR STERI-STRIP ANTIMIC 1/2X4 (GAUZE/BANDAGES/DRESSINGS) ×2 IMPLANT
COVER SURGICAL LIGHT HANDLE (MISCELLANEOUS) ×2 IMPLANT
DECANTER SPIKE VIAL GLASS SM (MISCELLANEOUS) ×6 IMPLANT
DRAPE 3/4 80X56 (DRAPES) ×2 IMPLANT
DRAPE INCISE IOBAN 66X45 STRL (DRAPES) ×2 IMPLANT
DRAPE ORTHO SPLIT 77X108 STRL (DRAPES) ×4
DRAPE POUCH INSTRU U-SHP 10X18 (DRAPES) ×2 IMPLANT
DRAPE SURG ORHT 6 SPLT 77X108 (DRAPES) ×2 IMPLANT
DRAPE U-SHAPE 47X51 STRL (DRAPES) ×2 IMPLANT
DRESSING AQUACEL AG SP 3.5X10 (GAUZE/BANDAGES/DRESSINGS) ×1 IMPLANT
DRSG AQUACEL AG ADV 3.5X14 (GAUZE/BANDAGES/DRESSINGS) ×1 IMPLANT
DRSG AQUACEL AG SP 3.5X10 (GAUZE/BANDAGES/DRESSINGS) ×2
DURAPREP 26ML APPLICATOR (WOUND CARE) ×3 IMPLANT
ELECT BLADE TIP CTD 4 INCH (ELECTRODE) ×2 IMPLANT
ELECT REM PT RETURN 15FT ADLT (MISCELLANEOUS) ×2 IMPLANT
FACESHIELD WRAPAROUND (MASK) ×6 IMPLANT
FACESHIELD WRAPAROUND OR TEAM (MASK) ×3 IMPLANT
GLOVE SRG 8 PF TXTR STRL LF DI (GLOVE) ×2 IMPLANT
GLOVE SURG ORTHO LTX SZ8 (GLOVE) ×2 IMPLANT
GLOVE SURG POLYISO LF SZ7.5 (GLOVE) ×2 IMPLANT
GLOVE SURG UNDER POLY LF SZ8 (GLOVE) ×4
GOWN STRL REUS W/TWL XL LVL3 (GOWN DISPOSABLE) ×4 IMPLANT
HEAD CERAMIC 36 PLUS 8.5 12 14 (Hips) ×1 IMPLANT
HOLDER FOLEY CATH W/STRAP (MISCELLANEOUS) ×2 IMPLANT
HOOD PEEL AWAY FLYTE STAYCOOL (MISCELLANEOUS) ×2 IMPLANT
IMMOBILIZER KNEE 20 (SOFTGOODS) ×2 IMPLANT
IMMOBILIZER KNEE 20 THIGH 36 (SOFTGOODS) ×1 IMPLANT
KIT BASIN OR (CUSTOM PROCEDURE TRAY) ×2 IMPLANT
KIT TURNOVER KIT A (KITS) IMPLANT
LINER NEUTRAL 52X36X52 PLUS 4 (Liner) ×1 IMPLANT
MANIFOLD NEPTUNE II (INSTRUMENTS) ×2 IMPLANT
NEEDLE HYPO 22GX1.5 SAFETY (NEEDLE) ×4 IMPLANT
NS IRRIG 1000ML POUR BTL (IV SOLUTION) ×2 IMPLANT
PACK TOTAL JOINT (CUSTOM PROCEDURE TRAY) ×2 IMPLANT
PIN SECTOR W/GRIP ACE CUP 52MM (Hips) ×1 IMPLANT
PROTECTOR NERVE ULNAR (MISCELLANEOUS) ×2 IMPLANT
STAPLER VISISTAT 35W (STAPLE) IMPLANT
STEM FEM ACTIS HIGH SZ7 (Stem) ×1 IMPLANT
STRIP CLOSURE SKIN 1/2X4 (GAUZE/BANDAGES/DRESSINGS) ×3 IMPLANT
SUCTION FRAZIER HANDLE 12FR (TUBING) ×1
SUCTION TUBE FRAZIER 12FR DISP (TUBING) ×1 IMPLANT
SUT ETHIBOND NAB CT1 #1 30IN (SUTURE) ×8 IMPLANT
SUT MNCRL AB 3-0 PS2 18 (SUTURE) ×3 IMPLANT
SUT VIC AB 0 CT1 36 (SUTURE) ×5 IMPLANT
SUT VIC AB 2-0 CT1 27 (SUTURE) ×4
SUT VIC AB 2-0 CT1 TAPERPNT 27 (SUTURE) ×2 IMPLANT
SYR CONTROL 10ML LL (SYRINGE) ×4 IMPLANT
TOWEL OR 17X26 10 PK STRL BLUE (TOWEL DISPOSABLE) ×2 IMPLANT
TRAY CATH INTERMITTENT SS 16FR (CATHETERS) ×1 IMPLANT
TRAY FOLEY MTR SLVR 16FR STAT (SET/KITS/TRAYS/PACK) ×2 IMPLANT
WATER STERILE IRR 1000ML POUR (IV SOLUTION) ×4 IMPLANT

## 2021-05-14 NOTE — Anesthesia Procedure Notes (Signed)
Procedure Name: Intubation Date/Time: 05/14/2021 10:21 AM Performed by: Minerva Ends, CRNA Pre-anesthesia Checklist: Patient identified, Emergency Drugs available, Suction available and Patient being monitored Patient Re-evaluated:Patient Re-evaluated prior to induction Oxygen Delivery Method: Circle System Utilized Preoxygenation: Pre-oxygenation with 100% oxygen Induction Type: IV induction and Cricoid Pressure applied Ventilation: Mask ventilation without difficulty Laryngoscope Size: Miller and 2 Grade View: Grade I Tube type: Oral Number of attempts: 1 Airway Equipment and Method: Stylet Placement Confirmation: ETT inserted through vocal cords under direct vision, positive ETCO2 and breath sounds checked- equal and bilateral Secured at: 22 cm Tube secured with: Tape Dental Injury: Teeth and Oropharynx as per pre-operative assessment  Comments: IV induction [name] AM CRNA atraumatic- teeth and mouth as preop - missing front tooth other teeth very poor dentition- unchanged- bilat BS Ellender

## 2021-05-14 NOTE — Interval H&P Note (Signed)
History and Physical Interval Note:  05/14/2021 9:11 AM  Jeanette Bennett [facility]  has presented today for surgery, with the diagnosis of OA RIGHT HIP.  The various methods of treatment have been discussed with the patient and family. After consideration of risks, benefits and other options for treatment, the patient has consented to  Procedure(s): TOTAL HIP ARTHROPLASTY (Right) as a surgical intervention.  The patient's history has been reviewed, patient examined, no change in status, stable for surgery.  I have reviewed the patient's chart and labs.  Questions were answered to the patient's satisfaction.     Thera Flake

## 2021-05-14 NOTE — Discharge Instructions (Addendum)
INSTRUCTIONS AFTER JOINT REPLACEMENT  ° °Remove items at home which could result in a fall. This includes throw rugs or furniture in walking pathways °ICE to the affected joint every three hours while awake for 30 minutes at a time, for at least the first 3-5 days, and then as needed for pain and swelling.  Continue to use ice for pain and swelling. You may notice swelling that will progress down to the foot and ankle.  This is normal after surgery.  Elevate your leg when you are not up walking on it.   °Continue to use the breathing machine you got in the hospital (incentive spirometer) which will help keep your temperature down.  It is common for your temperature to cycle up and down following surgery, especially at night when you are not up moving around and exerting yourself.  The breathing machine keeps your lungs expanded and your temperature down. ° ° °DIET:  As you were doing prior to hospitalization, we recommend a well-balanced diet. ° °DRESSING / WOUND CARE / SHOWERING ° °Keep the surgical dressing until follow up.  The dressing is water proof, so you can shower without any extra covering.  IF THE DRESSING FALLS OFF or the wound gets wet inside, change the dressing with sterile gauze.  Please use good hand washing techniques before changing the dressing.  Do not use any lotions or creams on the incision until instructed by your surgeon.   ° °ACTIVITY ° °Increase activity slowly as tolerated, but follow the weight bearing instructions below.   °No driving for 6 weeks or until further direction given by your physician.  You cannot drive while taking narcotics.  °No lifting or carrying greater than 10 lbs. until further directed by your surgeon. °Avoid periods of inactivity such as sitting longer than an hour when not asleep. This helps prevent blood clots.  °You may return to work once you are authorized by your doctor.  ° ° ° °WEIGHT BEARING  ° °Weight bearing as tolerated with assist device (walker, cane,  etc) as directed, use it as long as suggested by your surgeon or therapist, typically at least 4-6 weeks. ° ° °EXERCISES ° °Results after joint replacement surgery are often greatly improved when you follow the exercise, range of motion and muscle strengthening exercises prescribed by your doctor. Safety measures are also important to protect the joint from further injury. Any time any of these exercises cause you to have increased pain or swelling, decrease what you are doing until you are comfortable again and then slowly increase them. If you have problems or questions, call your caregiver or physical therapist for advice.  ° °Rehabilitation is important following a joint replacement. After just a few days of immobilization, the muscles of the leg can become weakened and shrink (atrophy).  These exercises are designed to build up the tone and strength of the thigh and leg muscles and to improve motion. Often times heat used for twenty to thirty minutes before working out will loosen up your tissues and help with improving the range of motion but do not use heat for the first two weeks following surgery (sometimes heat can increase post-operative swelling).  ° °These exercises can be done on a training (exercise) mat, on the floor, on a table or on a bed. Use whatever works the best and is most comfortable for you.    Use music or television while you are exercising so that the exercises are a pleasant break in your   day. This will make your life better with the exercises acting as a break in your routine that you can look forward to.   Perform all exercises about fifteen times, three times per day or as directed.  You should exercise both the operative leg and the other leg as well.  Exercises include:   Quad Sets - Tighten up the muscle on the front of the thigh (Quad) and hold for 5-10 seconds.   Straight Leg Raises - With your knee straight (if you were given a brace, keep it on), lift the leg to 60  degrees, hold for 3 seconds, and slowly lower the leg.  Perform this exercise against resistance later as your leg gets stronger.  Leg Slides: Lying on your back, slowly slide your foot toward your buttocks, bending your knee up off the floor (only go as far as is comfortable). Then slowly slide your foot back down until your leg is flat on the floor again.  Angel Wings: Lying on your back spread your legs to the side as far apart as you can without causing discomfort.  Hamstring Strength:  Lying on your back, push your heel against the floor with your leg straight by tightening up the muscles of your buttocks.  Repeat, but this time bend your knee to a comfortable angle, and push your heel against the floor.  You may put a pillow under the heel to make it more comfortable if necessary.   A rehabilitation program following joint replacement surgery can speed recovery and prevent re-injury in the future due to weakened muscles. Contact your doctor or a physical therapist for more information on knee rehabilitation.    CONSTIPATION  Constipation is defined medically as fewer than three stools per week and severe constipation as less than one stool per week.  Even if you have a regular bowel pattern at home, your normal regimen is likely to be disrupted due to multiple reasons following surgery.  Combination of anesthesia, postoperative narcotics, change in appetite and fluid intake all can affect your bowels.   YOU MUST use at least one of the following options; they are listed in order of increasing strength to get the job done.  They are all available over the counter, and you may need to use some, POSSIBLY even all of these options:    Drink plenty of fluids (prune juice may be helpful) and high fiber foods Colace 100 mg by mouth twice a day  Senokot for constipation as directed and as needed Dulcolax (bisacodyl), take with full glass of water  Miralax (polyethylene glycol) once or twice a day as  needed.  If you have tried all these things and are unable to have a bowel movement in the first 3-4 days after surgery call either your surgeon or your primary doctor.    If you experience loose stools or diarrhea, hold the medications until you stool forms back up.  If your symptoms do not get better within 1 week or if they get worse, check with your doctor.  If you experience "the worst abdominal pain ever" or develop nausea or vomiting, please contact the office immediately for further recommendations for treatment.   ITCHING:  If you experience itching with your medications, try taking only a single pain pill, or even half a pain pill at a time.  You can also use Benadryl over the counter for itching or also to help with sleep.   TED HOSE STOCKINGS:  Use stockings on both  legs until for at least 2 weeks or as directed by physician office. They may be removed at night for sleeping.  MEDICATIONS:  See your medication summary on the After Visit Summary that nursing will review with you.  You may have some home medications which will be placed on hold until you complete the course of blood thinner medication.  It is important for you to complete the blood thinner medication as prescribed.  PRECAUTIONS:  If you experience chest pain or shortness of breath - call 911 immediately for transfer to the hospital emergency department.   If you develop a fever greater that 101 F, purulent drainage from wound, increased redness or drainage from wound, foul odor from the wound/dressing, or calf pain - CONTACT YOUR SURGEON.                                                   FOLLOW-UP APPOINTMENTS:  If you do not already have a post-op appointment, please call the office for an appointment to be seen by your surgeon.  Guidelines for how soon to be seen are listed in your After Visit Summary, but are typically between 1-4 weeks after surgery.  OTHER INSTRUCTIONS:   Knee Replacement:  Do not place pillow  under knee, focus on keeping the knee straight while resting. CPM instructions: 0-90 degrees, 2 hours in the morning, 2 hours in the afternoon, and 2 hours in the evening. Place foam block, curve side up under heel at all times except when in CPM or when walking.  DO NOT modify, tear, cut, or change the foam block in any way.  POST-OPERATIVE OPIOID TAPER INSTRUCTIONS: It is important to wean off of your opioid medication as soon as possible. If you do not need pain medication after your surgery it is ok to stop day one. Opioids include: Codeine, Hydrocodone(Norco, Vicodin), Oxycodone(Percocet, oxycontin) and hydromorphone amongst others.  Long term and even short term use of opiods can cause: Increased pain response Dependence Constipation Depression Respiratory depression And more.  Withdrawal symptoms can include Flu like symptoms Nausea, vomiting And more Techniques to manage these symptoms Hydrate well Eat regular healthy meals Stay active Use relaxation techniques(deep breathing, meditating, yoga) Do Not substitute Alcohol to help with tapering If you have been on opioids for less than two weeks and do not have pain than it is ok to stop all together.  Plan to wean off of opioids This plan should start within one week post op of your joint replacement. Maintain the same interval or time between taking each dose and first decrease the dose.  Cut the total daily intake of opioids by one tablet each day Next start to increase the time between doses. The last dose that should be eliminated is the evening dose.   MAKE SURE YOU:  Understand these instructions.  Get help right away if you are not doing well or get worse.    Thank you for letting us be a part of your medical care team.  It is a privilege we respect greatly.  We hope these instructions will help you stay on track for a fast and full recovery!     Weight Loss Tips  You will have a better chance of succeeding at  weight loss by changing your eating habits. Changing habits can be difficult, so this handout  offers tips to help you achieve your goal.  Set Yourself up for Success Keeping a food diary to record everything you eat and drink can help you be more mindful of your food choices. Your diary is your tool, so use whatever approach you like best: smartphone app, website, or paper/pencil. Record what you eat or drink immediately after you finish eating. It might seem overwhelming to write down everything. Start with the meal or time of day which you find the greatest challenge to manage.  Additional strategies that can set you up for success:  Measure your foods to be sure they match the portions on your daily plan. Keep your measuring cups and food scale on the kitchen counter to help remember to measure. Find a friend to support your change in habits. Be sure to tell your friend what kind of help will meet your needs. For example, do you need a walking partner, or do you want a sounding board for when the eating plan is challenging? Believe in yourself. Changing behavior is not just about willpower. It is also about believing you can do it. Your registered dietitian nutritionist (RDN) can recommend resources to help you succeed. Consider adopting the following strategies to help with weight loss:  Keep fresh fruit on your kitchen counter or at eye-level in the refrigerator so you are reminded its available for a snack. Consider using a meal delivery service for a few weeks to decrease trips to the grocery store, cut down on meal preparation effort, and to get recipe ideas. To balance the cost of this service, cut back on dining out. Add your own vegetables to frozen meals designed for weight-loss to get extra nutrients. Enjoy liquid meal replacements with a side dish of salad or fruit. Cooking to Lose Weight If you never cook for yourself, now is a great time to start! Cooking for yourself is one of the  best ways to control your diet and know exactly what ingredients are in the food. Think about how you prepare foods to figure out where you can cut some calories and fat without losing flavor. You may not even realize how many calories are adding up with certain cooking methods. Here are some ideas:    Food Item/Ingredient  Oil  Use a nonstick skillet and use just a splash of oil (1-2 tablespoons) instead of deep frying.  Seasoning  Adding herbs and spices can help you boost flavor while cutting down on fats like butter and oil. Parsley, chive, garlic, onion powder, paprika, pepper, and ginger will help make meals more flavorful.   Dairy  Use lower-fat dairy products in place of full-fat versions. For example, use low-fat or skim [place] yogurt instead of sour cream, or skim or 1% milk instead of 2% or whole milk, or light margarine instead of solid margarine or butter.  Vegetables  Vegetables taste better when roasted or browned. Try it with your favorite vegetables.  Find ways to add vegetables to your meals, like scrambled eggs with green peppers, roasted broccoli in macaroni and cheese, or finely diced mushrooms and zucchini in pasta sauce, or vegetables instead of meat on pizza.  Fruit  Find interesting ways to prepare fruits. For example, eat apple wedges with a dab of peanut butter and sprinkle of cinnamon or add a dollop of light whipped cream and a teaspoon of granola to fresh or canned peaches.  Grocery Shopping Strategies Buy fresh or frozen fruits and vegetables that you enjoy so they are  ready to snack on or to add to meals. Wash and cut up vegetables as soon as you get home from the store so they are ready to eat. Consider leaving snack chips, cookies, sweets, and other similar foods off your shopping list. You may choose to add them at a later time when you figure out how they fit into your food plan.   Copyright 2020  [employer]. All rights  reserved.

## 2021-05-14 NOTE — Anesthesia Postprocedure Evaluation (Signed)
Anesthesia Post Note  Patient: Jeanette Bennett [facility]  Procedure(s) Performed: TOTAL HIP ARTHROPLASTY (Right: Hip)     Patient location during evaluation: PACU Anesthesia Type: General Level of consciousness: awake Pain management: pain level controlled Vital Signs Assessment: post-procedure vital signs reviewed and stable Respiratory status: spontaneous breathing, nonlabored ventilation, respiratory function stable and patient connected to nasal cannula oxygen Cardiovascular status: blood pressure returned to baseline and stable Postop Assessment: no apparent nausea or vomiting Anesthetic complications: no   No notable events documented.  Last Vitals:  Vitals:   05/14/21 1615 05/14/21 1733  BP: (!) 154/97 (!) 145/77  Pulse:  92  Resp:    Temp:  36.9 C  SpO2:  93%    Last Pain:  Vitals:   05/14/21 1733  TempSrc: Oral  PainSc: 10-Worst pain ever                 Habib Kise P Joyice Magda

## 2021-05-14 NOTE — Plan of Care (Signed)

## 2021-05-14 NOTE — Anesthesia Procedure Notes (Signed)
Date/Time: 05/14/2021 12:52 PM Performed by: Minerva Ends, CRNA Oxygen Delivery Method: Simple face mask Placement Confirmation: positive ETCO2 and breath sounds checked- equal and bilateral Dental Injury: Teeth and Oropharynx as per pre-operative assessment

## 2021-05-14 NOTE — Interval H&P Note (Signed)
History and Physical Interval Note:  05/14/2021 7:51 AM  Jeanette Bennett [facility]  has presented today for surgery, with the diagnosis of OA RIGHT HIP.  The various methods of treatment have been discussed with the patient and family. After consideration of risks, benefits and other options for treatment, the patient has consented to  Procedure(s): TOTAL HIP ARTHROPLASTY (Right) as a surgical intervention.  The patient's history has been reviewed, patient examined, no change in status, stable for surgery.  I have reviewed the patient's chart and labs.  Questions were answered to the patient's satisfaction.     Thera Flake
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
## 2021-05-14 NOTE — Transfer of Care (Signed)
Immediate Anesthesia Transfer of Care Note  Patient: Jeanette Bennett [facility]  Procedure(s) Performed: TOTAL HIP ARTHROPLASTY (Right: Hip)  Patient Location: PACU  Anesthesia Type:General  Level of Consciousness: awake and alert   Airway & Oxygen Therapy: Patient Spontanous Breathing and Patient connected to face mask oxygen  Post-op Assessment: Report given to RN and Post -op Vital signs reviewed and stable  Post vital signs: Reviewed and stable  Last Vitals:  Vitals Value Taken Time  BP    Temp    Pulse    Resp 7 05/14/21 1304  SpO2    Vitals shown include unvalidated device data.  Last Pain:  Vitals:   05/14/21 0808  TempSrc:   PainSc: 6          Complications: No notable events documented.

## 2021-05-14 NOTE — Op Note (Signed)
[place], HOSIE MEDICAL RECORD NO: [id number] ACCOUNT NO: [id number] DATE OF BIRTH: [birth year] FACILITY: [facility] LOCATION: [facility] PHYSICIAN: W D. Carloyn Manner., MD  Operative Report   DATE OF PROCEDURE: 05/14/2021  PREOPERATIVE DIAGNOSIS:  Osteoarthritis of the hip.  POSTOPERATIVE DIAGNOSES:  1.  Osteoarthritis of the hip. 2.  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  SURGERY:  Right total hip (ACTIS size 7 femur with high offset 8.5 mm neck length, 36 mm ceramic hip ball, 52 mm Gription cup with +4 mm 10-degree liner).  SURGEON:  W D. Carloyn Manner., MD  ASSISTANTVincent Peyer, PA  ANESTHESIA:  General.  BLOOD LOSS:  750.  DESCRIPTION OF PROCEDURE:  The patient had an extended posterior approach to the hip made due to high BMI.  Initially, after exposure through the iliotibial band and gluteus maximus fascia, it was noted that the patient had a significant soft tissue  reaction.  The head was not visible, the neck was. Eventually, it seemed that the patient probably had a significant amount of AVN than the preoperative x-rays, which only showed OA.  Additionally, there was some question of whether there was actually a  fracture of the femoral neck.  We had to do a couple of provisional cuts on the femoral neck to gain exposure of the acetabulum and then deliver the head out, which had severe collapse.  Placed acetabular retractors on the anterior and posterior aspects  of the acetabulum.  The femur was progressively broached to accept #7 ACTIS stem with a provisional cut on the calcar about 1 fingerbreadth above. We then progressively reamed the acetabulum in about 45 degrees of abduction and 15-20 degrees of  anteversion settling on the 52 cup. Placement of the cup with excellent press fit noted and then the liner with buildup of the liner in about the 9 o'clock position.  Then, trialed off the broach noting that we used high offset and 8.5 mm neck length,  the hip  would not dislocate in any position of flexion with adduction and had to be internally rotated to approximately 80-90 degrees to dislocate the hip.  The final femoral components were inserted.  We had split the gluteus maximus fascia and  iliotibial band.  This was closed using #1 Ethibond. We had to do a capsulectomy essentially due to the severe reaction from the probable AVN as well as the possible fracture.  The subcutaneous tissues were closed with 0 and 2-0 Vicryl and the skin with  Monocryl. Placed in compressive dressing and knee immobilizer and taken to recovery room in stable condition.   ROH D: 05/14/2021 12:04:10 pm T: 05/14/2021 1:33:00 pm  JOB: [id number]/ [id number]

## 2021-05-14 NOTE — Brief Op Note (Signed)
05/14/2021  12:48 PM  PATIENT:  Jeanette Bennett  69 y.o. female  PRE-OPERATIVE DIAGNOSIS:  OA RIGHT HIP  POST-OPERATIVE DIAGNOSIS:  OA RIGHT HIP  PROCEDURE:  Procedure(s): TOTAL HIP ARTHROPLASTY (Right)  SURGEON:  Surgeon(s) and Role:    Frederico Hamman, MD - Primary  PHYSICIAN ASSISTANT: Margart Sickles, PA-C  ASSISTANTS: OR staff x1   ANESTHESIA:   local, spinal, and IV sedation  EBL:  750 mL   BLOOD ADMINISTERED:none  DRAINS: none   LOCAL MEDICATIONS USED:  MARCAINE     SPECIMEN:  Source of Specimen:  Synovium for tissue culture R hip, femoral head for pathology  DISPOSITION OF SPECIMEN:  PATHOLOGY  COUNTS:  YES  TOURNIQUET:  * No tourniquets in log *  DICTATION: .Other Dictation: Dictation Number unknown  PLAN OF CARE: Discharge to home after PACU  PATIENT DISPOSITION:  PACU - hemodynamically stable.   Delay start of Pharmacological VTE agent (>24hrs) due to surgical blood loss or risk of bleeding: yes

## 2021-05-14 NOTE — Anesthesia Preprocedure Evaluation (Addendum)
Anesthesia Evaluation  Patient identified by MRN, date of birth, ID band Patient awake    Reviewed: Allergy & Precautions, NPO status , Patient's Chart, lab work & pertinent test results, reviewed documented beta blocker date and time   Airway Mallampati: II  TM Distance: >3 FB Neck ROM: Full    Dental no notable dental hx. (+) Dental Advisory Given, Missing,    Pulmonary neg pulmonary ROS, former smoker,    Pulmonary exam normal breath sounds clear to auscultation       Cardiovascular hypertension, Pt. on medications and Pt. on home beta blockers Normal cardiovascular exam Rhythm:Regular Rate:Normal     Neuro/Psych PSYCHIATRIC DISORDERS Anxiety Depression Peripheral neuropathy    GI/Hepatic Neg liver ROS, GERD  Medicated and Controlled,  Endo/Other  Obesity  Renal/GU negative Renal ROS  negative genitourinary   Musculoskeletal  (+) Arthritis , Osteoarthritis,  OA right Hip Osteoporosis   Abdominal (+) + obese,   Peds  Hematology  (+) anemia ,   Anesthesia Other Findings   Reproductive/Obstetrics                            Anesthesia Physical Anesthesia Plan  ASA: 2  Anesthesia Plan: Spinal   Post-op Pain Management:    Induction: Intravenous  PONV Risk Score and Plan: Treatment may vary due to age or medical condition, Propofol infusion and Ondansetron  Airway Management Planned: Natural Airway and Simple Face Mask  Additional Equipment:   Intra-op Plan:   Post-operative Plan:   Informed Consent: I have reviewed the patients History and Physical, chart, labs and discussed the procedure including the risks, benefits and alternatives for the proposed anesthesia with the patient or authorized representative who has indicated his/her understanding and acceptance.     Dental advisory given  Plan Discussed with: CRNA and Anesthesiologist  Anesthesia Plan Comments:          Anesthesia Quick Evaluation

## 2021-05-15 LAB — CBC WITH DIFFERENTIAL/PLATELET
Abs Immature Granulocytes: 0.1 10*3/uL — ABNORMAL HIGH (ref 0.00–0.07)
Basophils Absolute: 0 10*3/uL (ref 0.0–0.1)
Basophils Relative: 0 %
Eosinophils Absolute: 0 10*3/uL (ref 0.0–0.5)
Eosinophils Relative: 0 %
HCT: 26.1 % — ABNORMAL LOW (ref 36.0–46.0)
Hemoglobin: 8.2 g/dL — ABNORMAL LOW (ref 12.0–15.0)
Immature Granulocytes: 1 %
Lymphocytes Relative: 14 %
Lymphs Abs: 1.7 10*3/uL (ref 0.7–4.0)
MCH: 28 pg (ref 26.0–34.0)
MCHC: 31.4 g/dL (ref 30.0–36.0)
MCV: 89.1 fL (ref 80.0–100.0)
Monocytes Absolute: 1 10*3/uL (ref 0.1–1.0)
Monocytes Relative: 8 %
Neutro Abs: 9.5 10*3/uL — ABNORMAL HIGH (ref 1.7–7.7)
Neutrophils Relative %: 77 %
Platelets: 336 10*3/uL (ref 150–400)
RBC: 2.93 MIL/uL — ABNORMAL LOW (ref 3.87–5.11)
RDW: 15 % (ref 11.5–15.5)
WBC: 12.3 10*3/uL — ABNORMAL HIGH (ref 4.0–10.5)
nRBC: 0 % (ref 0.0–0.2)

## 2021-05-15 LAB — BASIC METABOLIC PANEL
Anion gap: 8 (ref 5–15)
BUN: 16 mg/dL (ref 8–23)
CO2: 27 mmol/L (ref 22–32)
Calcium: 7.8 mg/dL — ABNORMAL LOW (ref 8.9–10.3)
Chloride: 101 mmol/L (ref 98–111)
Creatinine, Ser: 1.04 mg/dL — ABNORMAL HIGH (ref 0.44–1.00)
GFR, Estimated: 57 mL/min — ABNORMAL LOW (ref >60)
Glucose, Bld: 124 mg/dL — ABNORMAL HIGH (ref 70–99)
Potassium: 3.8 mmol/L (ref 3.5–5.1)
Sodium: 136 mmol/L (ref 135–145)

## 2021-05-15 MED ORDER — SODIUM CHLORIDE 0.9 % IV SOLN: INTRAVENOUS | Status: DC

## 2021-05-15 MED ORDER — SODIUM CHLORIDE 0.9 % IV BOLUS
1000.0000 mL | Freq: Once | INTRAVENOUS | Status: AC
  Administered 2021-05-15: 09:00:00 1000 mL via INTRAVENOUS

## 2021-05-15 NOTE — Progress Notes (Signed)
°   05/15/21 1349  Assess: MEWS Score  Temp 98.1 F (36.7 C)  BP (!) 93/59  Pulse Rate (!) 102  Resp 16  Level of Consciousness Alert  SpO2 92 %  O2 Device Room Air  Assess: MEWS Score  MEWS Temp 0  MEWS Systolic 1  MEWS Pulse 1  MEWS RR 0  MEWS LOC 0  MEWS Score 2  MEWS Score Color Yellow  Assess: if the MEWS score is Yellow or Red  Were vital signs taken at a resting state? Yes  Focused Assessment Change from prior assessment (see assessment flowsheet) (Sinus tach HR)  Does the patient meet 2 or more of the SIRS criteria? No  MEWS guidelines implemented *See Row Information* Yes  Treat  MEWS Interventions Other (Comment) (Consulted PA Margart Sickles)  Pain Scale 0-10  Pain Score 6  Pain Type Surgical pain  Pain Location Hip  Pain Orientation Right  Pain Descriptors / Indicators Aching;Constant  Pain Frequency Constant  Pain Onset With Activity  Pain Intervention(s) Emotional support;Distraction  Multiple Pain Sites No  Take Vital Signs  Increase Vital Sign Frequency  Yellow: Q 2hr X 2 then Q 4hr X 2, if remains yellow, continue Q 4hrs  Escalate  MEWS: Escalate Yellow: discuss with charge nurse/RN and consider discussing with provider and RRT  Notify: Charge Nurse/RN  Name of Charge Nurse/RN Notified Towanda Malkin, RN  Date Charge Nurse/RN Notified 05/15/21  Time Charge Nurse/RN Notified 1349  Notify: Provider  Provider Name/Title Margart Sickles, PA  Date Provider Notified 05/15/21  Time Provider Notified 1409  Notification Type Call  Notification Reason Change in status  Provider response See new orders (Normal saline @ 100 ml/hr and put in order for CBC for 5 am 05/16/21 to make sure that hemoglobin is not dropping below 8.0.)  Date of Provider Response 05/15/21  Time of Provider Response 1409 Ivin Booty [place], [place] answered phone)  Assess: SIRS CRITERIA  SIRS Temperature  0  SIRS Pulse 1  SIRS Respirations  0  SIRS WBC 0  SIRS Score Sum  1

## 2021-05-15 NOTE — Progress Notes (Signed)
MEWS score now green.  

## 2021-05-15 NOTE — Progress Notes (Signed)
Physical Therapy Treatment Patient Details Name: Jeanette Bennett MRN: [id number] DOB: 09/20/1950 Today's Date: 05/15/2021   History of Present Illness Patient is 70 y.o. female s/p Rt THA posterior approach on 05/14/21 with pMH significant for anemia, OA, anxiety, depression, HTN, GERD, Rt RCR.    PT Comments    Pt very cooperative and progressing with mobility but limited this am by c/o dizziness with activity and noted soft BP.   Recommendations for follow up therapy are one component of a multi-disciplinary discharge planning process, led by the attending physician.  Recommendations may be updated based on patient status, additional functional criteria and insurance authorization.  Follow Up Recommendations  Follow physician's recommendations for discharge plan and follow up therapies     Assistance Recommended at Discharge Frequent or constant Supervision/Assistance  Equipment Recommendations  Rolling walker (2 wheels);BSC/3in1    Recommendations for Other Services OT consult     Precautions / Restrictions Precautions Precautions: Fall Restrictions Weight Bearing Restrictions: No Other Position/Activity Restrictions: WBAT     Mobility  Bed Mobility Overal bed mobility: Needs Assistance Bed Mobility: Supine to Sit     Supine to sit: Mod assist;HOB elevated     General bed mobility comments: cues for sequence, use of L LE to self assist and adherence to THP.    Transfers Overall transfer level: Needs assistance Equipment used: Rolling walker (2 wheels) Transfers: Sit to/from Stand;Bed to chair/wheelchair/BSC Sit to Stand: Mod assist;From elevated surface;+2 safety/equipment     Step pivot transfers: Min assist;+2 physical assistance;+2 safety/equipment;From elevated surface     General transfer comment: cues for hand placement for power up and assist to steady with rise and achieve upright posture.    Ambulation/Gait               General  Gait Details: To chair only 2* c/o dizziness and noted soft BP   Stairs             Wheelchair Mobility    Modified Rankin (Stroke Patients Only)       Balance Overall balance assessment: Needs assistance Sitting-balance support: Feet supported Sitting balance-Leahy Scale: Good     Standing balance support: Reliant on assistive device for balance;During functional activity;Bilateral upper extremity supported Standing balance-Leahy Scale: Poor                              Cognition Arousal/Alertness: Awake/alert Behavior During Therapy: WFL for tasks assessed/performed Overall Cognitive Status: Within Functional Limits for tasks assessed                                          Exercises Total Joint Exercises Ankle Circles/Pumps: AROM;15 reps;Supine;Both Quad Sets: AROM;Both;10 reps;Supine Heel Slides: AAROM;Right;20 reps;Supine Hip ABduction/ADduction: AAROM;Right;15 reps;Supine    General Comments        Pertinent Vitals/Pain Pain Assessment: 0-10 Pain Location: Rt hip Pain Descriptors / Indicators: Aching;Discomfort Pain Intervention(s): Limited activity within patient's tolerance;Monitored during session;Premedicated before session    Home Living                          Prior Function            PT Goals (current goals can now be found in the care plan section) Acute Rehab PT Goals Patient Stated Goal: get walking  again PT Goal Formulation: With patient Time For Goal Achievement: 05/21/21 Potential to Achieve Goals: Good Progress towards PT goals: Progressing toward goals    Frequency    7X/week      PT Plan Current plan remains appropriate    Co-evaluation              AM-PAC PT "6 Clicks" Mobility   Outcome Measure  Help needed turning from your back to your side while in a flat bed without using bedrails?: A Lot Help needed moving from lying on your back to sitting on the side of a  flat bed without using bedrails?: A Lot Help needed moving to and from a bed to a chair (including a wheelchair)?: A Lot Help needed standing up from a chair using your arms (e.g., wheelchair or bedside chair)?: A Lot Help needed to walk in hospital room?: Total Help needed climbing 3-5 steps with a railing? : Total 6 Click Score: 10    End of Session Equipment Utilized During Treatment: Gait belt Activity Tolerance: Patient tolerated treatment well Patient left: in chair;with call bell/phone within reach;with chair alarm set Nurse Communication: Mobility status PT Visit Diagnosis: Muscle weakness (generalized) (M62.81);Unsteadiness on feet (R26.81);Difficulty in walking, not elsewhere classified (R26.2)     Time: 7096-2836 PT Time Calculation (min) (ACUTE ONLY): 36 min  Charges:  $Therapeutic Exercise: 8-22 mins $Therapeutic Activity: 8-22 mins                     Mauro Kaufmann PT Acute Rehabilitation Services Pager [phone] Office [phone]    Jeanette Bennett 05/15/2021, 1:02 PM

## 2021-05-15 NOTE — Progress Notes (Signed)
PT Cancellation Note  Patient Details Name: Jeanette Bennett MRN: [id number] DOB: Jun 07, 1950   Cancelled Treatment:     PT pm session deferred at pt request.  Pt reports too much pain despite premed and requesting additional pain meds.  Will follow.   [name] 05/15/2021, 4:23 PM

## 2021-05-15 NOTE — Plan of Care (Signed)
  Problem: Education: Goal: Knowledge of General Education information will improve Description Including pain rating scale, medication(s)/side effects and non-pharmacologic comfort measures Outcome: Progressing   Problem: Health Behavior/Discharge Planning: Goal: Ability to manage health-related needs will improve Outcome: Progressing   

## 2021-05-15 NOTE — Progress Notes (Signed)
Subjective: 1 Day Post-Op Procedure(s) (LRB): TOTAL HIP ARTHROPLASTY (Right) Patient reports pain as mild.  Found to have femoral neck fracture intraop likely from AVN and collapse so pt reports has been essentially WC bound for months leading up to surgery.  Objective: Vital signs in last 24 hours: Temp:  [97.6 F (36.4 C)-98.6 F (37 C)] 98.1 F (36.7 C) (12/24 0547) Pulse Rate:  [79-97] 86 (12/24 0845) Resp:  [11-20] 18 (12/24 0547) BP: (91-154)/(56-97) 93/73 (12/24 0845) SpO2:  [89 %-100 %] 93 % (12/24 0628) Weight:  [103.4 kg] 103.4 kg (12/23 1733)  Intake/Output from previous day: 12/23 0701 - 12/24 0700 In: 3380 [P.O.:180; I.V.:2700; IV Piggyback:500] Out: 1701 [Urine:951; Blood:750] Intake/Output this shift: No intake/output data recorded.  Recent Labs    05/15/21 0327  HGB 8.2*   Recent Labs    05/15/21 0327  WBC 12.3*  RBC 2.93*  HCT 26.1*  PLT 336   Recent Labs    05/15/21 0327  NA 136  K 3.8  CL 101  CO2 27  BUN 16  CREATININE 1.04*  GLUCOSE 124*  CALCIUM 7.8*   No results for input(s): LABPT, INR in the last 72 hours.  Sensation intact distally Intact pulses distally Dorsiflexion/Plantar flexion intact Incision: dressing C/D/I   Assessment/Plan: 1 Day Post-Op Procedure(s) (LRB): TOTAL HIP ARTHROPLASTY (Right) Up with therapy WBAT RLE, Post hip precautions Pain control as ordered ASA dvt proph Hypotensive this AM 1L fluid bolus   Patient with generalized weakness per PT and slow to mobilize, will need to get good assessment today to determine if home will be an option within a day or two vs SNF.     Anticipated LOS equal to or greater than 2 midnights due to - Age 19 and older with one or more of the following:  - Obesity  - Expected need for hospital services (PT, OT, Nursing) required for safe  discharge  - Anticipated need for postoperative skilled nursing care or inpatient rehab  - Active co-morbidities: Anemia OR   -  Unanticipated findings during/Post Surgery: Slow post-op progression: GI, pain control, mobility  - Patient is a high risk of re-admission due to: None   Margart Sickles 05/15/2021, 8:55 AM

## 2021-05-15 NOTE — Plan of Care (Signed)

## 2021-05-15 NOTE — Progress Notes (Deleted)
°   05/15/21 1550  Assess: MEWS Score  Temp 98 F (36.7 C)  BP (!) 93/57  Pulse Rate 68  Resp 16  SpO2 90 %  O2 Device Nasal Cannula (SpO2 was low (80's) before placing nasal cannula on. once nasal cannula was used, patients SpO2 increased to 90)  Assess: MEWS Score  MEWS Temp 0  MEWS Systolic 1  MEWS Pulse 0  MEWS RR 0  MEWS LOC 0  MEWS Score 1  MEWS Score Color Green  Document  Patient Outcome Stabilized after interventions  Assess: SIRS CRITERIA  SIRS Temperature  0  SIRS Pulse 0  SIRS Respirations  0  SIRS WBC 0  SIRS Score Sum  0

## 2021-05-16 ENCOUNTER — Observation Stay (HOSPITAL_COMMUNITY): Payer: Medicare PPO

## 2021-05-16 DIAGNOSIS — J9601 Acute respiratory failure with hypoxia: Secondary | ICD-10-CM | POA: Diagnosis not present

## 2021-05-16 DIAGNOSIS — G9340 Encephalopathy, unspecified: Secondary | ICD-10-CM | POA: Diagnosis not present

## 2021-05-16 LAB — CBC WITH DIFFERENTIAL/PLATELET
Abs Immature Granulocytes: 0.11 10*3/uL — ABNORMAL HIGH (ref 0.00–0.07)
Basophils Absolute: 0.1 10*3/uL (ref 0.0–0.1)
Basophils Relative: 0 %
Eosinophils Absolute: 0 10*3/uL (ref 0.0–0.5)
Eosinophils Relative: 0 %
HCT: 25.3 % — ABNORMAL LOW (ref 36.0–46.0)
Hemoglobin: 8.1 g/dL — ABNORMAL LOW (ref 12.0–15.0)
Immature Granulocytes: 1 %
Lymphocytes Relative: 13 %
Lymphs Abs: 1.8 10*3/uL (ref 0.7–4.0)
MCH: 28.9 pg (ref 26.0–34.0)
MCHC: 32 g/dL (ref 30.0–36.0)
MCV: 90.4 fL (ref 80.0–100.0)
Monocytes Absolute: 1.1 10*3/uL — ABNORMAL HIGH (ref 0.1–1.0)
Monocytes Relative: 8 %
Neutro Abs: 10.9 10*3/uL — ABNORMAL HIGH (ref 1.7–7.7)
Neutrophils Relative %: 78 %
Platelets: 347 10*3/uL (ref 150–400)
RBC: 2.8 MIL/uL — ABNORMAL LOW (ref 3.87–5.11)
RDW: 14.9 % (ref 11.5–15.5)
WBC: 14 10*3/uL — ABNORMAL HIGH (ref 4.0–10.5)
nRBC: 0 % (ref 0.0–0.2)

## 2021-05-16 LAB — CBC
HCT: 23.3 % — ABNORMAL LOW (ref 36.0–46.0)
Hemoglobin: 7.4 g/dL — ABNORMAL LOW (ref 12.0–15.0)
MCH: 28.7 pg (ref 26.0–34.0)
MCHC: 31.8 g/dL (ref 30.0–36.0)
MCV: 90.3 fL (ref 80.0–100.0)
Platelets: 308 10*3/uL (ref 150–400)
RBC: 2.58 MIL/uL — ABNORMAL LOW (ref 3.87–5.11)
RDW: 15.1 % (ref 11.5–15.5)
WBC: 14.3 10*3/uL — ABNORMAL HIGH (ref 4.0–10.5)
nRBC: 0 % (ref 0.0–0.2)

## 2021-05-16 LAB — BLOOD GAS, ARTERIAL
Acid-base deficit: 3.2 mmol/L — ABNORMAL HIGH (ref 0.0–2.0)
Bicarbonate: 22.2 mmol/L (ref 20.0–28.0)
FIO2: 60
O2 Content: 10 L/min
O2 Saturation: 96.3 %
Patient temperature: 98.6
pCO2 arterial: 44.7 mmHg (ref 32.0–48.0)
pH, Arterial: 7.316 — ABNORMAL LOW (ref 7.350–7.450)
pO2, Arterial: 92.7 mmHg (ref 83.0–108.0)

## 2021-05-16 LAB — MRSA NEXT GEN BY PCR, NASAL: MRSA by PCR Next Gen: NOT DETECTED

## 2021-05-16 LAB — BASIC METABOLIC PANEL
Anion gap: 4 — ABNORMAL LOW (ref 5–15)
BUN: 21 mg/dL (ref 8–23)
CO2: 22 mmol/L (ref 22–32)
Calcium: 7 mg/dL — ABNORMAL LOW (ref 8.9–10.3)
Chloride: 106 mmol/L (ref 98–111)
Creatinine, Ser: 0.97 mg/dL (ref 0.44–1.00)
GFR, Estimated: >60 mL/min (ref >60)
Glucose, Bld: 139 mg/dL — ABNORMAL HIGH (ref 70–99)
Potassium: 4.3 mmol/L (ref 3.5–5.1)
Sodium: 132 mmol/L — ABNORMAL LOW (ref 135–145)

## 2021-05-16 LAB — GLUCOSE, CAPILLARY
Glucose-Capillary: 123 mg/dL — ABNORMAL HIGH (ref 70–99)
Glucose-Capillary: 140 mg/dL — ABNORMAL HIGH (ref 70–99)

## 2021-05-16 LAB — LACTIC ACID, PLASMA: Lactic Acid, Venous: 1.8 mmol/L (ref 0.5–1.9)

## 2021-05-16 LAB — D-DIMER, QUANTITATIVE: D-Dimer, Quant: 1.52 ug/mL-FEU — ABNORMAL HIGH (ref 0.00–0.50)

## 2021-05-16 LAB — TROPONIN I (HIGH SENSITIVITY)
Troponin I (High Sensitivity): 22 ng/L — ABNORMAL HIGH (ref <18)
Troponin I (High Sensitivity): 28 ng/L — ABNORMAL HIGH (ref <18)

## 2021-05-16 MED ORDER — SODIUM CHLORIDE 0.9 % IV SOLN
2500.0000 mg | Freq: Once | INTRAVENOUS | Status: AC
  Administered 2021-05-16: 21:00:00 2500 mg via INTRAVENOUS
  Filled 2021-05-16: qty 25

## 2021-05-16 MED ORDER — SODIUM CHLORIDE 0.9 % IV BOLUS: 250.0000 mL | Freq: Once | INTRAVENOUS | Status: DC | PRN

## 2021-05-16 MED ORDER — CHLORHEXIDINE GLUCONATE 0.12 % MT SOLN
15.0000 mL | Freq: Two times a day (BID) | OROMUCOSAL | Status: DC
  Administered 2021-05-17 – 2021-05-23 (×12): 15 mL via OROMUCOSAL
  Filled 2021-05-16 (×12): qty 15

## 2021-05-16 MED ORDER — LACTATED RINGERS IV BOLUS
1000.0000 mL | Freq: Once | INTRAVENOUS | Status: AC
Start: 2021-05-16 — End: 2021-05-16
  Administered 2021-05-16: 20:00:00 1000 mL via INTRAVENOUS

## 2021-05-16 MED ORDER — SODIUM CHLORIDE (PF) 0.9 % IJ SOLN
INTRAMUSCULAR | Status: AC
  Filled 2021-05-16: qty 50

## 2021-05-16 MED ORDER — NALOXONE HCL 0.4 MG/ML IJ SOLN: 0.4000 mg | INTRAMUSCULAR | Status: DC | PRN

## 2021-05-16 MED ORDER — LORAZEPAM 2 MG/ML IJ SOLN
1.0000 mg | INTRAMUSCULAR | Status: DC | PRN
Start: 2021-05-16 — End: 2021-05-24

## 2021-05-16 MED ORDER — CHLORHEXIDINE GLUCONATE CLOTH 2 % EX PADS
6.0000 | MEDICATED_PAD | Freq: Every day | CUTANEOUS | Status: DC
  Administered 2021-05-16 – 2021-05-23 (×8): 6 via TOPICAL

## 2021-05-16 MED ORDER — ORAL CARE MOUTH RINSE
15.0000 mL | Freq: Two times a day (BID) | OROMUCOSAL | Status: DC
  Administered 2021-05-18 – 2021-05-23 (×9): 15 mL via OROMUCOSAL

## 2021-05-16 MED ORDER — ACETAMINOPHEN 325 MG PO TABS
650.0000 mg | ORAL_TABLET | Freq: Four times a day (QID) | ORAL | Status: DC
  Administered 2021-05-16: 10:00:00 650 mg via ORAL
  Filled 2021-05-16 (×2): qty 2

## 2021-05-16 MED ORDER — IOHEXOL 350 MG/ML SOLN
80.0000 mL | Freq: Once | INTRAVENOUS | Status: AC | PRN
  Administered 2021-05-16: 21:00:00 80 mL via INTRAVENOUS

## 2021-05-16 MED ORDER — NALOXONE HCL 0.4 MG/ML IJ SOLN
INTRAMUSCULAR | Status: AC
  Filled 2021-05-16: qty 1

## 2021-05-16 MED ORDER — ACETAMINOPHEN 650 MG RE SUPP
650.0000 mg | RECTAL | Status: AC | PRN
  Administered 2021-05-17 (×2): 650 mg via RECTAL
  Filled 2021-05-16 (×2): qty 1

## 2021-05-16 NOTE — Significant Event (Signed)
Rapid Response Event Note   Reason for Call : Change in LOC, increase lethargy with a  decrease in oxygen saturation.    Initial Focused Assessment:  Patient found to be unresponsive to sternal rub, primary nurse reports that patient received Dilaudid prn early this am around 0800 and Oxy at 1020. Narcan per protocol  Patient appears to be seizing with blood noted in mouth; oral suction, maintained oral airway.  Family at bedside.    Interventions:  Narcan per protocol.  Transfer to ICU EKG ABG Bolus  CT head Pending  Troponin Pending   Plan of Care:  Transfer to ICU with CCM consult  ABG Pending     Event Summary:  Patient transferred to ICU for closer monitoring, Dr. Delton Coombes consulted  MD Notified:  Thyra Breed PA Call Time: 1800 Arrival Time: 1811  End Time: 1900  Sharyn Lull Latayvia Mandujano, RN

## 2021-05-16 NOTE — Progress Notes (Addendum)
Orthopaedic Trauma Progress Note  SUBJECTIVE: Pain in right leg this AM. I have scheduled Tylenol. Want to avoid increasing narcotics as I do not want patient to become too sedated. Will hold off on Toradol or Celebrex for now as Hgb continues to trend downward. Hypotensive this AM but denies any dizziness or lightheadness. No chest pain. No SOB. No nausea/vomiting. No other complaints.   OBJECTIVE:  Vitals:   05/16/21 0130 05/16/21 0557  BP: 105/67 (!) 97/59  Pulse: 99 96  Resp: 17 18  Temp: 99.7 F (37.6 C) 99.4 F (37.4 C)  SpO2: 100% 99%    General: Layin ing bed, NAD Respiratory: No increased work of breathing.  RLE: Dressing with minimal strikethrough. Tender over hip and throughout thigh.endorses sensation throughout extremity. Able to wiggle toes. +DP pulse.    LABS:  Results for orders placed or performed during the hospital encounter of 05/14/21 (from the past 24 hour(s))  CBC with Differential/Platelet     Status: Abnormal   Collection Time: 05/16/21  7:29 AM  Result Value Ref Range   WBC 14.0 (H) 4.0 - 10.5 K/uL   RBC 2.80 (L) 3.87 - 5.11 MIL/uL   Hemoglobin 8.1 (L) 12.0 - 15.0 g/dL   HCT 37.6 (L) 28.3 - 15.1 %   MCV 90.4 80.0 - 100.0 fL   MCH 28.9 26.0 - 34.0 pg   MCHC 32.0 30.0 - 36.0 g/dL   RDW 76.1 60.7 - 37.1 %   Platelets 347 150 - 400 K/uL   nRBC 0.0 0.0 - 0.2 %   Neutrophils Relative % 78 %   Neutro Abs 10.9 (H) 1.7 - 7.7 K/uL   Lymphocytes Relative 13 %   Lymphs Abs 1.8 0.7 - 4.0 K/uL   Monocytes Relative 8 %   Monocytes Absolute 1.1 (H) 0.1 - 1.0 K/uL   Eosinophils Relative 0 %   Eosinophils Absolute 0.0 0.0 - 0.5 K/uL   Basophils Relative 0 %   Basophils Absolute 0.1 0.0 - 0.1 K/uL   Immature Granulocytes 1 %   Abs Immature Granulocytes 0.11 (H) 0.00 - 0.07 K/uL    ASSESSMENT: Jeanette Bennett is a 70 y.o. female, 2 Days Post-Op s/p RIGHT TOTAL HIP ARTHROPLASTY  CV/Blood loss: Acute blood loss anemia, Hgb 8.1 this AM. BP  soft.  PLAN: Weightbearing: WBAT RLE Incisional and dressing care:  Change PRN Showering: Ok to shower with assistance, Aquacel dressing may get wet   Orthopedic device(s):  Knee immobilizer while in bed   Pain management:  1. Tylenol 650 mg q 6 hours scheduled 2. Oxycodone 5-10 mg q 4 hours PRN 3. Neurontin 100 mg BID 4. Dilaudid 0.5-1 mg q 4 hours PRN VTE prophylaxis: Aspirin 81 mg, SCDs Foley/Lines:  No foley, KVO IVFs Dispo: Continue PT/OT as tolerated. Not ready for d/c today. Continue to monitor BP, will hold off on bolus for now. Recheck CBC tomorrow AM.  Patient with generalized weakness per PT and slow to mobilize, will need to get good assessment today to determine if home will be an option within a day or so versus SNF.   Follow - up plan: 1-2 weeks with Dr. Simonne Come information:  Truitt Merle MD, Thyra Breed PA-C. After hours and holidays please check [email or web address] for group call information for [facility]   Thompson Caul, PA-C [phone] (office) [email or web address]

## 2021-05-16 NOTE — Progress Notes (Signed)
Patient was found to have oxygen saturation in the 50's. Pt not responding to commands. Pt was up to 89% on 10 L 02. Rapid Response RN Britta Mccreedy was called. RN Fredric Mare [facility]) was also notified. Ulyses Southward, PA was also notified. PA is pulling in the parking lot now to go see the patient. Patient has been transferred to ICU (room 1233) for higher level of care/evaluation.

## 2021-05-16 NOTE — Plan of Care (Signed)
  Problem: Education: Goal: Knowledge of General Education information will improve Description Including pain rating scale, medication(s)/side effects and non-pharmacologic comfort measures Outcome: Progressing   

## 2021-05-16 NOTE — Progress Notes (Signed)
Called at 1752. Judeth Cornfield RN went in to give patient medication around 1700 and patient was found to have oxygen saturation in the 50s and was not reponding to commands. Rapid Response called. Narcan was given with no significant response. While on the phone with RN, patient noted to be seizing with blood coming from her mouth. Patient urgently transferred to ICU. I arrived to see patient at 27, she is in ICU at this point. She opens eyes to verbal stimuli but is unable to track with her eyes. Patient's husband is at bedside, notes she was last seen alert and conversant around lunchtime. He states patient was sleeping soundly around 2 pm and he noted she appeared more lethargic beyond this point. Per PT/OT notes, patient was more lethargic today and had increased difficulty following commands. Per husband, patient with no history of seizures. Received IV Dilaudid at 0741 this AM and Oxycodone at 1020 AM. No additional pain medications given per [facility].  ABG has been ordered, results pending. CCM has been consulted, Dr. Delton Coombes has seen patient and agrees to assume care of patient. Ortho will continue to follow.   Thompson Caul PA-C [facility] [phone] (office) [email or web address]

## 2021-05-16 NOTE — Evaluation (Addendum)
Occupational Therapy Evaluation Patient Details Name: Jeanette Bennett MRN: [id number] DOB: 01/02/1951 Today's Date: 05/16/2021   History of Present Illness Patient is 70 y.o. female s/p Rt THA posterior approach on 05/14/21 with pMH significant for anemia, OA, anxiety, depression, HTN, GERD, Rt RCR.   Clinical Impression   Patient is a 70 year old female who was admitted for above. Patient is max A +2 for bed mobility and standing with increased time and patient noted to have difficulty with following commands. Patient was continuously reporting " I have to pee" but then nothing would result. Patient was inconsistent with coordination and strength in BUE when tested during session.  Patients pain, cognitive status, decreased functional activity tolerance, and endurance impacted participation in ADLs on this date. Patient would continue to benefit from skilled OT services at this time while admitted and after d/c to address noted deficits in order to improve overall safety and independence in ADLs.        Recommendations for follow up therapy are one component of a multi-disciplinary discharge planning process, led by the attending physician.  Recommendations may be updated based on patient status, additional functional criteria and insurance authorization.   Follow Up Recommendations       Assistance Recommended at Discharge Frequent or constant Supervision/Assistance  Functional Status Assessment  Patient has had a recent decline in their functional status and demonstrates the ability to make significant improvements in function in a reasonable and predictable amount of time.  Equipment Recommendations  Other (comment) (TBD)    Recommendations for Other Services       Precautions / Restrictions Precautions Precautions: Fall Precaution Comments: posterior hip precautions Restrictions Weight Bearing Restrictions: No Other Position/Activity Restrictions: WBAT      Mobility  Bed Mobility Overal bed mobility: Needs Assistance Bed Mobility: Supine to Sit     Supine to sit: +2 for safety/equipment;Mod assist;+2 for physical assistance Sit to supine: Max assist;+2 for safety/equipment;+2 for physical assistance   General bed mobility comments: cues for sequence, use of L LE to self assist and adherence to THP.    Transfers Overall transfer level: Needs assistance Equipment used: Rolling walker (2 wheels) Transfers: Sit to/from Stand Sit to Stand: Mod assist;From elevated surface;+2 safety/equipment           General transfer comment: cues for hand placement for power up and assist to steady with rise and achieve upright posture.      Balance Overall balance assessment: Needs assistance Sitting-balance support: Feet supported Sitting balance-Leahy Scale: Good     Standing balance support: Reliant on assistive device for balance;During functional activity;Bilateral upper extremity supported Standing balance-Leahy Scale: Poor                             ADL either performed or assessed with clinical judgement   ADL Overall ADL's : Needs assistance/impaired Eating/Feeding: Minimal assistance;Bed level Eating/Feeding Details (indicate cue type and reason): coordination on this date. with increased time to attend to tasks Grooming: Oral care;Wash/dry face;Sitting;Bed level Grooming Details (indicate cue type and reason): patient completed sitting in bed with increased time and cues to attend to tasks. suspect medications are effecting participation Upper Body Bathing: Moderate assistance;Bed level at bed level with increased cues to attend to tasks and cues for sequencing of task as well.    Lower Body Bathing: Bed level;Total assistance   Upper Body Dressing : Moderate assistance;Bed level   Lower Body Dressing:  Bed level;Total assistance   Toilet Transfer: +2 for physical assistance;+2 for safety/equipment;Total assistance;Cueing for  sequencing;Cueing for safety Toilet Transfer Details (indicate cue type and reason): unable to complete transfer to commode with patient reporting i have to pee each attempt at movement. patient was not noted to be wet at this time. Toileting- Clothing Manipulation and Hygiene: Total assistance;Bed level       Functional mobility during ADLs: +2 for physical assistance;+2 for safety/equipment;Rolling walker (2 wheels)       Vision Patient Visual Report: No change from baseline       Perception     Praxis      Pertinent Vitals/Pain Pain Assessment: 0-10 Pain Score: 10-Worst pain ever Pain Location: Rt hip Pain Descriptors / Indicators: Aching;Discomfort Pain Intervention(s): Limited activity within patient's tolerance;Monitored during session;Premedicated before session;Repositioned     Hand Dominance Right   Extremity/Trunk Assessment Upper Extremity Assessment Upper Extremity Assessment: RUE deficits/detail;LUE deficits/detail;Difficult to assess due to impaired cognition RUE Deficits / Details: patient was noted to have inconsistent use of BUE suspected pain medication levels might have effect. patient noted to have difficult time following commands for MMT but strength was 4-/5 in BUE on this time   Lower Extremity Assessment Lower Extremity Assessment: Defer to PT evaluation   Cervical / Trunk Assessment Cervical / Trunk Assessment: Normal;Other exceptions Cervical / Trunk Exceptions: habitus   Communication Communication Communication: No difficulties   Cognition                                             General Comments       Exercises     Shoulder Instructions      Home Living Family/patient expects to be discharged to:: Private residence Living Arrangements: Spouse/significant other;Other relatives Available Help at Discharge: Family Type of Home: House Home Access: Stairs to enter [employer] of Steps: 4 Entrance  Stairs-Rails: None Home Layout: One level     Bathroom Shower/Tub: Tub/shower unit;Walk-in shower     Bathroom Accessibility: Yes   Home Equipment: Standard Walker;BSC/3in1;Wheelchair - manual   Additional Comments: husband and grandson and another friend will assist her      Prior Functioning/Environment Prior Level of Function : Needs assist             Mobility Comments: pt reports she has been limited to wheelchair mobility for ~3 months and only transfers from bed<>wc<>recliner. ADLs Comments: sink bath for last 3 months        OT Problem List: Decreased strength;Increased edema;Obesity;Decreased range of motion;Decreased activity tolerance;Impaired balance (sitting and/or standing);Decreased safety awareness;Decreased cognition;Decreased knowledge of precautions;Cardiopulmonary status limiting activity;Decreased knowledge of use of DME or AE      OT Treatment/Interventions: Self-care/ADL training;Therapeutic exercise;Neuromuscular education;Energy conservation;DME and/or AE instruction;Therapeutic activities;Balance training;Patient/family education    OT Goals(Current goals can be found in the care plan section) Acute Rehab OT Goals Patient Stated Goal: i have to pee OT Goal Formulation: With patient Time For Goal Achievement: 05/30/21 Potential to Achieve Goals: Fair  OT Frequency: Min 2X/week   Barriers to D/C: Decreased caregiver support          Co-evaluation PT/OT/SLP Co-Evaluation/Treatment: Yes Reason for Co-Treatment: For patient/therapist safety;To address functional/ADL transfers PT goals addressed during session: Mobility/safety with mobility OT goals addressed during session: ADL's and self-care      AM-PAC OT "6 Clicks" Daily Activity  Outcome Measure Help from another person eating meals?: A Little Help from another person taking care of personal grooming?: A Little Help from another person toileting, which includes using toliet, bedpan,  or urinal?: A Lot Help from another person bathing (including washing, rinsing, drying)?: A Lot Help from another person to put on and taking off regular upper body clothing?: A Lot Help from another person to put on and taking off regular lower body clothing?: A Lot 6 Click Score: 14   End of Session Equipment Utilized During Treatment: Gait belt;Rolling walker (2 wheels) Nurse Communication: Mobility status  Activity Tolerance: Patient limited by pain;Treatment limited secondary to medical complications (Comment) Patient left: in bed;with call bell/phone within reach;with nursing/sitter in room;with bed alarm set  OT Visit Diagnosis: Unsteadiness on feet (R26.81);Muscle weakness (generalized) (M62.81);Other symptoms and signs involving cognitive function                Time: [id number] OT Time Calculation (min): 44 min Charges:  OT General Charges $OT Visit: 1 Visit OT Evaluation $OT Eval Moderate Complexity: 1 Mod OT Treatments $Self Care/Home Management : 8-22 mins  Sharyn Blitz OTR/L, MS Acute Rehabilitation Department Office# [phone] Pager# [phone]   Ardyth Harps 05/16/2021, 11:55 AM

## 2021-05-16 NOTE — Progress Notes (Signed)
Physical Therapy Treatment Patient Details Name: Jeanette Bennett MRN: [id number] DOB: [birth year] Today's Date: 05/16/2021   History of Present Illness Patient is 70 y.o. female s/p Rt THA posterior approach on 05/14/21 with pMH significant for anemia, OA, anxiety, depression, HTN, GERD, Rt RCR.    PT Comments    Pt with noted increased lethargy and requiring increased encouragement to gain participation and with noted increased difficulty processing and following through with cues.  Pt up to EOB sitting, became incontinent of urine and assisted to stand and side-step to top of bed before returning to changed bed. Pt requiring increased time and increased assist for all tasks this date - pain meds??  Recommendations for follow up therapy are one component of a multi-disciplinary discharge planning process, led by the attending physician.  Recommendations may be updated based on patient status, additional functional criteria and insurance authorization.  Follow Up Recommendations  Follow physician's recommendations for discharge plan and follow up therapies     Assistance Recommended at Discharge Frequent or constant Supervision/Assistance  Equipment Recommendations  Rolling walker (2 wheels);BSC/3in1    Recommendations for Other Services OT consult     Precautions / Restrictions Precautions Precautions: Fall Precaution Comments: posterior hip precautions Restrictions Weight Bearing Restrictions: No Other Position/Activity Restrictions: WBAT     Mobility  Bed Mobility Overal bed mobility: Needs Assistance Bed Mobility: Supine to Sit;Sit to Supine     Supine to sit: +2 for safety/equipment;Mod assist;+2 for physical assistance Sit to supine: Max assist;+2 for safety/equipment;+2 for physical assistance   General bed mobility comments: cues for sequence, use of L LE to self assist and adherence to THP.    Transfers Overall transfer level: Needs assistance Equipment  used: Rolling walker (2 wheels) Transfers: Sit to/from Stand Sit to Stand: Mod assist;From elevated surface;+2 safety/equipment           General transfer comment: cues for hand placement for power up and assist to steady with rise and achieve upright posture.    Ambulation/Gait Ambulation/Gait assistance: Mod assist;+2 physical assistance;+2 safety/equipment Gait Distance (Feet): 3 Feet Assistive device: Rolling walker (2 wheels) Gait Pattern/deviations: Step-to pattern;Decreased stride length;Decreased weight shift to right;Shuffle Gait velocity: decr     General Gait Details: Pt side-stepped up side of bed only for safety   Stairs             Wheelchair Mobility    Modified Rankin (Stroke Patients Only)       Balance Overall balance assessment: Needs assistance Sitting-balance support: Feet supported Sitting balance-Leahy Scale: Fair     Standing balance support: Reliant on assistive device for balance;During functional activity;Bilateral upper extremity supported Standing balance-Leahy Scale: Poor                              Cognition Arousal/Alertness: Awake/alert Behavior During Therapy: WFL for tasks assessed/performed Overall Cognitive Status: Within Functional Limits for tasks assessed                                          Exercises      General Comments        Pertinent Vitals/Pain Pain Assessment: 0-10 Pain Score: 10-Worst pain ever Pain Location: Rt hip Pain Descriptors / Indicators: Aching;Discomfort Pain Intervention(s): Limited activity within patient's tolerance;Monitored during session;Premedicated before session;Ice applied    Home Living  Family/patient expects to be discharged to:: Private residence Living Arrangements: Spouse/significant other;Other relatives Available Help at Discharge: Family Type of Home: House Home Access: Stairs to enter Entrance Stairs-Rails: None Entrance  Stairs-Number of Steps: 4   Home Layout: One level Home Equipment: Standard Walker;BSC/3in1;Wheelchair - manual Additional Comments: husband and grandson and another friend will assist her    Prior Function            PT Goals (current goals can now be found in the care plan section) Acute Rehab PT Goals Patient Stated Goal: get walking again PT Goal Formulation: With patient Time For Goal Achievement: 05/21/21 Potential to Achieve Goals: Good Progress towards PT goals: Not progressing toward goals - comment (Increased fatigue/lethargy and difficulty following commands)    Frequency    7X/week      PT Plan Current plan remains appropriate    Co-evaluation PT/OT/SLP Co-Evaluation/Treatment: Yes Reason for Co-Treatment: For patient/therapist safety PT goals addressed during session: Mobility/safety with mobility OT goals addressed during session: ADL's and self-care      AM-PAC PT "6 Clicks" Mobility   Outcome Measure  Help needed turning from your back to your side while in a flat bed without using bedrails?: A Lot Help needed moving from lying on your back to sitting on the side of a flat bed without using bedrails?: A Lot Help needed moving to and from a bed to a chair (including a wheelchair)?: A Lot Help needed standing up from a chair using your arms (e.g., wheelchair or bedside chair)?: A Lot Help needed to walk in hospital room?: Total Help needed climbing 3-5 steps with a railing? : Total 6 Click Score: 10    End of Session Equipment Utilized During Treatment: Gait belt Activity Tolerance: Patient limited by fatigue;Patient limited by pain Patient left: in bed;with call bell/phone within reach;with bed alarm set;with nursing/sitter in room Nurse Communication: Mobility status PT Visit Diagnosis: Muscle weakness (generalized) (M62.81);Unsteadiness on feet (R26.81);Difficulty in walking, not elsewhere classified (R26.2);Pain Pain - Right/Left: Right Pain -  part of body: Hip     Time: 1121-1150 PT Time Calculation (min) (ACUTE ONLY): 29 min  Charges:  $Therapeutic Activity: 8-22 mins                     Mauro Kaufmann PT Acute Rehabilitation Services Pager [phone] Office [phone]    Monesha Monreal 05/16/2021, 1:04 PM

## 2021-05-16 NOTE — Progress Notes (Signed)
An USGPIV (ultrasound guided PIV) has been placed for short-term vasopressor infusion. A correctly placed ivWatch must be used when administering Vasopressors. Should this treatment be needed beyond 72 hours, central line access should be obtained.  It will be the responsibility of the bedside nurse to follow best practice to prevent extravasations.   ?

## 2021-05-16 NOTE — Consult Note (Signed)
NAME:  Jeanette Bennett, MRN:  [id number], DOB:  [birth year], LOS: 0 ADMISSION DATE:  05/14/2021, CONSULTATION DATE: 05/16/2021 REFERRING MD: Dr. Madelon Lips, CHIEF COMPLAINT: Hypoxemia, unresponsiveness  History of Present Illness:  70 year old woman with a history of hypertension, GERD, anxiety/depression, anemia and osteoarthritis.  She underwent a posterior approach right total hip surgery on 05/14/2021.  Some hypotension over the last 24 hours, BP meds were held this morning 12/25.  She has had some progressive lethargy through the day 12/25, did work with PT around 1300.  Husband at bedside noted her to be sleeping heavily around 1400.  She reportedly received Dilaudid around 7:40AM, oxycodone 10 mg at 418 and 1020.  She does not have a known history of OSA.  She was found unresponsive around 6 PM would not wake to voice or sternal rub.  Oxygen saturations in 50's% on RA.  Question some short-lived seizure activity, apparently bit her tongue.  Oxygen placed and Narcan given and she moved to the ICU urgently.   Pertinent  Medical History   Past Medical History:  Diagnosis Date   Anemia    Anxiety    Arthritis    Depression    GERD (gastroesophageal reflux disease)    Hypertension    Tuberculosis     Significant Hospital Events: Including procedures, antibiotic start and stop dates in addition to other pertinent events   Right hip replacement 12/23 12/25 found hypoxic and unresponsive on RA. Possible seizure activity 12/25 ECG > irregular baseline but looks sinus, no ST changes.   Interim History / Subjective:  Saturating better since O2 added More alert but not back to baseline  Objective   Blood pressure 104/63, pulse 100, temperature 99.9 F (37.7 C), temperature source Oral, resp. rate 18, height 5\' 11"  (1.803 m), weight 103.4 kg, SpO2 95 %.        Intake/Output Summary (Last 24 hours) at 05/16/2021 1829 Last data filed at 05/16/2021 1700 Gross per 24 hour  Intake  2501.33 ml  Output 700 ml  Net 1801.33 ml   Filed Weights   05/14/21 0808 05/14/21 1733  Weight: 103.4 kg 103.4 kg    Examination: General: obese woman, laying in bed.  No respiratory distress, lethargic HENT: Blood from oropharynx, tongue laceration, no secretions.  Pupils equal.  Mild expiratory stridor/secretions heard Lungs: Referred upper airway noise, no crackles or wheezes Cardiovascular: Regular, distant Abdomen: Obese, nondistended positive bowel sounds Extremities: No edema Neuro: Opens eyes to voice and stimulation.  Appears to track.  Did not answer questions, moaned response, unintelligible.  Followed commands, did stick out her tongue GU: Deferred  Resolved Hospital Problem list     Assessment & Plan:   Acute hypoxemic respiratory failure.  Transient and resolved with placement of nasal cannula oxygen.  Suspect for suppression due to her narcotics. Also on ddx Acute PE, looks like she was not on chemical DVT prophylaxis -Pulmonary hygiene -Hold narcotics -Wean O2 for SPO2 90% -Consider positive pressure, CPAP or BiPAP if submental oxygen and adequate. -Protecting airway adequately although some secretions -STAT CXR  Acute encephalopathy.  Suspect a component of medication effect from her narcotics.  Concerning also for possible hypoxemic injury.  No evidence for acute hypercapnia on her ABG.  There was approximately 5 hours between when she worked with PT and when she was found unresponsive.  Saturations at that time in the 19s.  Remains aphasic.  At risk for either acute CVA, global hypoxic injury.  Consider also postictal state  as there was a period of witnessed activity suspicious for seizures.  Resolved on its own. -Maintain adequate oxygenation -Consider BiPAP although she is not hypercapnic on ABG. -Stat head CT to rule out acute bleed, evidence for CVA -Seizure precautions.  Unclear whether there is any role to empiric antiepileptics at this time, will discuss  w Neuro -Neuro consultation -probable cerebelle testing after head CT -Hold narcotics and other sedating medication  Tongue laceration -Some dried blood in the oropharynx, does not appear to be active currently -Follow CBC  History of hypertension, relative hypotension/normotension over last 24 hours -Hold Maxide, Avapro, Norvasc -She is receiving IV fluid bolus at the time of transfer -if progressive hypotension then will need to eval for possible sepsis, cardiac event. At risk shock -repeat ECG now  Anxiety depression -Continue her Cymbalta, Neurontin  Hyperlipidemia -Continue atorvastatin   Best Practice (right click and "Reselect all SmartList Selections" daily)   Diet/type: NPO DVT prophylaxis: SCD GI prophylaxis: PPI Lines: N/A Foley:  N/A Code Status:  full code Last date of multidisciplinary goals of care discussion [pending] Discussed status and plans with husband at bedside  Labs   CBC: Recent Labs  Lab 05/15/21 0327 05/16/21 0729  WBC 12.3* 14.0*  NEUTROABS 9.5* 10.9*  HGB 8.2* 8.1*  HCT 26.1* 25.3*  MCV 89.1 90.4  PLT 336 347    Basic Metabolic Panel: Recent Labs  Lab 05/15/21 0327  NA 136  K 3.8  CL 101  CO2 27  GLUCOSE 124*  BUN 16  CREATININE 1.04*  CALCIUM 7.8*   GFR: Estimated Creatinine Clearance: 64.7 mL/min (A) (by C-G formula based on SCr of 1.04 mg/dL (H)). Recent Labs  Lab 05/15/21 0327 05/16/21 0729  WBC 12.3* 14.0*    Liver Function Tests: No results for input(s): AST, ALT, ALKPHOS, BILITOT, PROT, ALBUMIN in the last 168 hours. No results for input(s): LIPASE, AMYLASE in the last 168 hours. No results for input(s): AMMONIA in the last 168 hours.  ABG No results found for: PHART, PCO2ART, PO2ART, HCO3, TCO2, ACIDBASEDEF, O2SAT   Coagulation Profile: No results for input(s): INR, PROTIME in the last 168 hours.  Cardiac Enzymes: No results for input(s): CKTOTAL, CKMB, CKMBINDEX, TROPONINI in the last 168  hours.  HbA1C: No results found for: HGBA1C  CBG: Recent Labs  Lab 05/16/21 1742 05/16/21 1753  GLUCAP 140* 123*    Review of Systems:   Unable to obtain due to MS  Past Medical History:  She,  has a past medical history of Anemia, Anxiety, Arthritis, Depression, GERD (gastroesophageal reflux disease), Hypertension, and Tuberculosis.   Surgical History:   Past Surgical History:  Procedure Laterality Date   ABDOMINAL HYSTERECTOMY     APPENDECTOMY     LAMINECTOMY     ROTATOR CUFF REPAIR Right    TONSILLECTOMY     WISDOM TOOTH EXTRACTION       Social History:   reports that she has quit smoking. Her smoking use included cigarettes. She has never used smokeless tobacco. She reports current alcohol use. She reports current drug use. Frequency: 1.00 time per week. Drug: Marijuana.   Family History:  Her family history is not on file.   Allergies Allergies  Allergen Reactions   Other Shortness Of Breath    Tree Nuts   Sulfa Antibiotics Nausea And Vomiting and Rash     Home Medications  Prior to Admission medications   Medication Sig Start Date End Date Taking? Authorizing Provider  acetaminophen (TYLENOL) 650  MG CR tablet Take 1,300 mg by mouth every 8 (eight) hours as needed for pain.   Yes [provider]  alendronate (FOSAMAX) 70 MG tablet Take 70 mg by mouth every Monday. Take with a full glass of water on an empty stomach.   Yes [provider]  amLODipine (NORVASC) 10 MG tablet Take 10 mg by mouth daily.   Yes [provider]  aspirin EC 81 MG tablet Take 1 tablet (81 mg total) by mouth 2 (two) times daily. TO PREVENT BLOOD CLOTS 05/14/21 06/13/21 Yes Chadwell, Ivin Booty, PA-C  atorvastatin (LIPITOR) 80 MG tablet Take 40 mg by mouth daily.   Yes [provider]  calcium carbonate (TUMS - DOSED IN MG ELEMENTAL CALCIUM) 500 MG chewable tablet Chew 1,500 mg by mouth 3 (three) times daily as needed for indigestion or heartburn.   Yes  [provider]  colchicine 0.6 MG tablet Take 0.6-1.2 mg by mouth See admin instructions. Take 1.2 mg at onset of gout flare then take 0.6 mg 1 hour later as needed for gout   Yes [provider]  diclofenac Sodium (VOLTAREN) 1 % GEL Apply 1 application topically 4 (four) times daily.   Yes [provider]  docusate sodium (COLACE) 100 MG capsule Take 1 capsule (100 mg total) by mouth daily as needed. 05/14/21 05/14/22 Yes Chadwell, Ivin Booty, PA-C  DULoxetine (CYMBALTA) 30 MG capsule Take 30 mg by mouth daily.   Yes [provider]  esomeprazole (NEXIUM) 40 MG capsule Take 40 mg by mouth daily.   Yes [provider]  gabapentin (NEURONTIN) 100 MG capsule Take 100 mg by mouth 2 (two) times daily.   Yes [provider]  loratadine (CLARITIN) 10 MG tablet Take 10 mg by mouth daily.   Yes [provider]  Multiple Vitamin (MULTIVITAMIN WITH MINERALS) TABS tablet Take 1 tablet by mouth daily.   Yes [provider]  Nebivolol HCl 20 MG TABS Take 20 mg by mouth daily.   Yes [provider]  oxyCODONE (OXY IR/ROXICODONE) 5 MG immediate release tablet Take one tab po q4-6hrs prn pain 05/14/21  Yes Chadwell, Ivin Booty, PA-C  potassium chloride (KLOR-CON M) 10 MEQ tablet Take 10 mEq by mouth daily.   Yes [provider]  triamterene-hydrochlorothiazide (MAXZIDE) 75-50 MG tablet Take 1 tablet by mouth daily.   Yes [provider]  valsartan (DIOVAN) 160 MG tablet Take 160 mg by mouth daily.   Yes [provider]  Vitamin D, Ergocalciferol, (DRISDOL) 1.25 MG (50000 UNIT) CAPS capsule Take 50,000 Units by mouth every Monday.   Yes [provider]     Critical care time: 61 minutes     Levy Pupa, MD, PhD 05/16/2021, 6:54 PM [facility] [phone] or if no answer before 7:00PM call [phone] For any issues after 7:00PM please call eLink [phone]

## 2021-05-16 NOTE — Progress Notes (Signed)
RT NOTE:  ABG drawn by RT and sent to lab. Lab notified.

## 2021-05-16 NOTE — Progress Notes (Addendum)
eLink Physician-Brief Progress Note Patient Name: Jeanette Bennett [facility] DOB: 04/28/1951 MRN: [id number]   Date of Service  05/16/2021  HPI/Events of Note  Notified of hypotension SBP 70s. Noted events earlier and workup in progress. Did get fluids earlier with good response. Patient is to go down for a CT scan  Seen opening eyes to verbal stimuli  eICU Interventions  Ordered another LR bolus Included urinalysis to work up Will follow up on blood tests and CT scan        Darl Pikes 05/16/2021, 7:49 PM

## 2021-05-17 ENCOUNTER — Inpatient Hospital Stay (HOSPITAL_COMMUNITY): Payer: Medicare PPO

## 2021-05-17 ENCOUNTER — Inpatient Hospital Stay (HOSPITAL_COMMUNITY)
Admission: RE | Admit: 2021-05-17 | Discharge: 2021-05-17 | Disposition: A | Discharge status: Home / Self Care | Payer: Medicare PPO | Source: Home / Self Care | Attending: Orthopedic Surgery | Admitting: Orthopedic Surgery

## 2021-05-17 DIAGNOSIS — E669 Obesity, unspecified: Secondary | ICD-10-CM | POA: Diagnosis present

## 2021-05-17 DIAGNOSIS — E785 Hyperlipidemia, unspecified: Secondary | ICD-10-CM | POA: Diagnosis present

## 2021-05-17 DIAGNOSIS — Z20822 Contact with and (suspected) exposure to covid-19: Secondary | ICD-10-CM | POA: Diagnosis present

## 2021-05-17 DIAGNOSIS — I700 Atherosclerosis of aorta: Secondary | ICD-10-CM | POA: Diagnosis present

## 2021-05-17 DIAGNOSIS — G8929 Other chronic pain: Secondary | ICD-10-CM | POA: Diagnosis present

## 2021-05-17 DIAGNOSIS — I10 Essential (primary) hypertension: Secondary | ICD-10-CM | POA: Diagnosis present

## 2021-05-17 DIAGNOSIS — D62 Acute posthemorrhagic anemia: Secondary | ICD-10-CM | POA: Diagnosis not present

## 2021-05-17 DIAGNOSIS — J9601 Acute respiratory failure with hypoxia: Secondary | ICD-10-CM | POA: Diagnosis not present

## 2021-05-17 DIAGNOSIS — Z7982 Long term (current) use of aspirin: Secondary | ICD-10-CM | POA: Diagnosis not present

## 2021-05-17 DIAGNOSIS — Z6831 Body mass index (BMI) 31.0-31.9, adult: Secondary | ICD-10-CM | POA: Diagnosis not present

## 2021-05-17 DIAGNOSIS — R7401 Elevation of levels of liver transaminase levels: Secondary | ICD-10-CM | POA: Diagnosis not present

## 2021-05-17 DIAGNOSIS — R569 Unspecified convulsions: Secondary | ICD-10-CM | POA: Diagnosis not present

## 2021-05-17 DIAGNOSIS — I9581 Postprocedural hypotension: Secondary | ICD-10-CM | POA: Diagnosis not present

## 2021-05-17 DIAGNOSIS — M109 Gout, unspecified: Secondary | ICD-10-CM | POA: Diagnosis present

## 2021-05-17 DIAGNOSIS — G9341 Metabolic encephalopathy: Secondary | ICD-10-CM | POA: Diagnosis present

## 2021-05-17 DIAGNOSIS — M25551 Pain in right hip: Secondary | ICD-10-CM | POA: Diagnosis not present

## 2021-05-17 DIAGNOSIS — R4701 Aphasia: Secondary | ICD-10-CM | POA: Diagnosis not present

## 2021-05-17 DIAGNOSIS — F419 Anxiety disorder, unspecified: Secondary | ICD-10-CM | POA: Diagnosis present

## 2021-05-17 DIAGNOSIS — E119 Type 2 diabetes mellitus without complications: Secondary | ICD-10-CM | POA: Diagnosis present

## 2021-05-17 DIAGNOSIS — K5900 Constipation, unspecified: Secondary | ICD-10-CM | POA: Diagnosis not present

## 2021-05-17 DIAGNOSIS — M1611 Unilateral primary osteoarthritis, right hip: Secondary | ICD-10-CM | POA: Diagnosis present

## 2021-05-17 DIAGNOSIS — G9340 Encephalopathy, unspecified: Secondary | ICD-10-CM | POA: Diagnosis not present

## 2021-05-17 DIAGNOSIS — Z7983 Long term (current) use of bisphosphonates: Secondary | ICD-10-CM | POA: Diagnosis not present

## 2021-05-17 DIAGNOSIS — F32A Depression, unspecified: Secondary | ICD-10-CM | POA: Diagnosis present

## 2021-05-17 DIAGNOSIS — G4733 Obstructive sleep apnea (adult) (pediatric): Secondary | ICD-10-CM | POA: Diagnosis present

## 2021-05-17 DIAGNOSIS — K219 Gastro-esophageal reflux disease without esophagitis: Secondary | ICD-10-CM | POA: Diagnosis present

## 2021-05-17 DIAGNOSIS — S01512A Laceration without foreign body of oral cavity, initial encounter: Secondary | ICD-10-CM | POA: Diagnosis not present

## 2021-05-17 LAB — COMPREHENSIVE METABOLIC PANEL
ALT: 306 U/L — ABNORMAL HIGH (ref 0–44)
AST: 593 U/L — ABNORMAL HIGH (ref 15–41)
Albumin: 2.3 g/dL — ABNORMAL LOW (ref 3.5–5.0)
Alkaline Phosphatase: 51 U/L (ref 38–126)
Anion gap: 7 (ref 5–15)
BUN: 18 mg/dL (ref 8–23)
CO2: 23 mmol/L (ref 22–32)
Calcium: 7.1 mg/dL — ABNORMAL LOW (ref 8.9–10.3)
Chloride: 106 mmol/L (ref 98–111)
Creatinine, Ser: 0.83 mg/dL (ref 0.44–1.00)
GFR, Estimated: >60 mL/min (ref >60)
Glucose, Bld: 103 mg/dL — ABNORMAL HIGH (ref 70–99)
Potassium: 3.9 mmol/L (ref 3.5–5.1)
Sodium: 136 mmol/L (ref 135–145)
Total Bilirubin: 0.8 mg/dL (ref 0.3–1.2)
Total Protein: 5.4 g/dL — ABNORMAL LOW (ref 6.5–8.1)

## 2021-05-17 LAB — RESP PANEL BY RT-PCR (FLU A&B, COVID) ARPGX2
Influenza A by PCR: NEGATIVE
Influenza B by PCR: NEGATIVE
SARS Coronavirus 2 by RT PCR: NEGATIVE

## 2021-05-17 LAB — CBC
HCT: 22.3 % — ABNORMAL LOW (ref 36.0–46.0)
Hemoglobin: 7.1 g/dL — ABNORMAL LOW (ref 12.0–15.0)
MCH: 28.5 pg (ref 26.0–34.0)
MCHC: 31.8 g/dL (ref 30.0–36.0)
MCV: 89.6 fL (ref 80.0–100.0)
Platelets: 315 10*3/uL (ref 150–400)
RBC: 2.49 MIL/uL — ABNORMAL LOW (ref 3.87–5.11)
RDW: 15 % (ref 11.5–15.5)
WBC: 13.5 10*3/uL — ABNORMAL HIGH (ref 4.0–10.5)
nRBC: 0 % (ref 0.0–0.2)

## 2021-05-17 LAB — URINALYSIS, ROUTINE W REFLEX MICROSCOPIC
Bacteria, UA: NONE SEEN
Bilirubin Urine: NEGATIVE
Glucose, UA: NEGATIVE mg/dL
Ketones, ur: NEGATIVE mg/dL
Leukocytes,Ua: NEGATIVE
Nitrite: NEGATIVE
Protein, ur: NEGATIVE mg/dL
Specific Gravity, Urine: 1.01 (ref 1.005–1.030)
pH: 6 (ref 5.0–8.0)

## 2021-05-17 LAB — LACTIC ACID, PLASMA: Lactic Acid, Venous: 1.1 mmol/L (ref 0.5–1.9)

## 2021-05-17 LAB — GLUCOSE, CAPILLARY: Glucose-Capillary: 87 mg/dL (ref 70–99)

## 2021-05-17 MED ORDER — OXYCODONE HCL 5 MG PO TABS
5.0000 mg | ORAL_TABLET | Freq: Four times a day (QID) | ORAL | Status: DC | PRN
  Administered 2021-05-17 – 2021-05-18 (×3): 5 mg via ORAL
  Filled 2021-05-17 (×3): qty 1

## 2021-05-17 MED ORDER — MORPHINE SULFATE (PF) 2 MG/ML IV SOLN
2.0000 mg | INTRAVENOUS | Status: DC | PRN
  Administered 2021-05-17 – 2021-05-18 (×4): 2 mg via INTRAVENOUS
  Filled 2021-05-17 (×4): qty 1

## 2021-05-17 MED ORDER — LACTATED RINGERS IV BOLUS
500.0000 mL | Freq: Once | INTRAVENOUS | Status: AC
  Administered 2021-05-17: 03:00:00 500 mL via INTRAVENOUS

## 2021-05-17 MED ORDER — KETOROLAC TROMETHAMINE 15 MG/ML IJ SOLN
7.5000 mg | Freq: Once | INTRAMUSCULAR | Status: AC | PRN
  Administered 2021-05-17: 07:00:00 7.5 mg via INTRAVENOUS
  Filled 2021-05-17: qty 1

## 2021-05-17 NOTE — Progress Notes (Addendum)
eLink Physician-Brief Progress Note Patient Name: Jeanette Bennett Jeanette Bennett DOB: 10-Jun-1950 MRN: [id number]   Date of Service  05/17/2021  HPI/Events of Note  Notified of BP trending down again Good response to fluid bolus earlier No significant laboratory abnormality Head CT without acute findings and without LVO UA WBC 0-5 O2 sat 100% making PE less likely  eICU Interventions  Ordered another 500 cc LR bolus     Intervention Category Intermediate Interventions: Hypotension - evaluation and management  Rosalie Gums Maddix Heinz 05/17/2021, 3:07 AM

## 2021-05-17 NOTE — Progress Notes (Signed)
Orthopaedic Trauma Progress Note  SUBJECTIVE: Patient transferred down to ICU last night due to low oxygen saturation and a period of witnessed activity suspicious for seizure. Head CT negative for any acute findings. Patient more alert this morning. Answering questions. Does not remember events from yesterday evening. Continues to have pain in right hip. No other complaints. No family at bedside currently.  OBJECTIVE:  Vitals:   05/17/21 0800 05/17/21 0810  BP:  136/78  Pulse:    Resp:  12  Temp: 98.1 F (36.7 C)   SpO2:      General: Laying in bed, NAD Respiratory: No increased work of breathing.  RLE: Dressing with minimal strikethrough. Tender over hip and throughout thigh. Endorses sensation throughout extremity. Able to wiggle toes. Ankle DF/PF intact. 2+ DP pulse.    LABS:  Results for orders placed or performed during the hospital encounter of 05/14/21 (from the past 24 hour(s))  Glucose, capillary     Status: Abnormal   Collection Time: 05/16/21  5:42 PM  Result Value Ref Range   Glucose-Capillary 140 (H) 70 - 99 mg/dL  Glucose, capillary     Status: Abnormal   Collection Time: 05/16/21  5:53 PM  Result Value Ref Range   Glucose-Capillary 123 (H) 70 - 99 mg/dL  Blood gas, arterial     Status: Abnormal   Collection Time: 05/16/21  6:21 PM  Result Value Ref Range   FIO2 60.00    O2 Content 10.0 L/min   Delivery systems NO CHARGE    pH, Arterial 7.316 (L) 7.350 - 7.450   pCO2 arterial 44.7 32.0 - 48.0 mmHg   pO2, Arterial 92.7 83.0 - 108.0 mmHg   Bicarbonate 22.2 20.0 - 28.0 mmol/L   Acid-base deficit 3.2 (H) 0.0 - 2.0 mmol/L   O2 Saturation 96.3 %   Patient temperature 98.6    Collection site LEFT RADIAL    Sample type ARTERIAL    Allens test (pass/fail) PASS PASS  CBC     Status: Abnormal   Collection Time: 05/16/21  7:31 PM  Result Value Ref Range   WBC 14.3 (H) 4.0 - 10.5 K/uL   RBC 2.58 (L) 3.87 - 5.11 MIL/uL   Hemoglobin 7.4 (L) 12.0 - 15.0 g/dL   HCT  62.8 (L) 31.5 - 46.0 %   MCV 90.3 80.0 - 100.0 fL   MCH 28.7 26.0 - 34.0 pg   MCHC 31.8 30.0 - 36.0 g/dL   RDW 17.6 16.0 - 73.7 %   Platelets 308 150 - 400 K/uL   nRBC 0.0 0.0 - 0.2 %  Basic metabolic panel     Status: Abnormal   Collection Time: 05/16/21  7:31 PM  Result Value Ref Range   Sodium 132 (L) 135 - 145 mmol/L   Potassium 4.3 3.5 - 5.1 mmol/L   Chloride 106 98 - 111 mmol/L   CO2 22 22 - 32 mmol/L   Glucose, Bld 139 (H) 70 - 99 mg/dL   BUN 21 8 - 23 mg/dL   Creatinine, Ser 1.06 0.44 - 1.00 mg/dL   Calcium 7.0 (L) 8.9 - 10.3 mg/dL   GFR, Estimated >26 >94 mL/min   Anion gap 4 (L) 5 - 15  Culture, blood (routine x 2)     Status: None (Preliminary result)   Collection Time: 05/16/21  7:31 PM   Specimen: BLOOD RIGHT HAND  Result Value Ref Range   Specimen Description      BLOOD RIGHT HAND Performed at [facility], [address] [place], [place] [address]    Special Requests      BOTTLES DRAWN AEROBIC ONLY Blood Culture results may not be optimal due to an inadequate volume of blood received in culture bottles Performed at [facility], [address] [place], [place] [address]    Culture      NO GROWTH < 12 HOURS Performed at [facility], [address], [place], [place] [address]    Report Status PENDING   Culture, blood (routine x 2)     Status: None (Preliminary result)   Collection Time: 05/16/21  7:31 PM   Specimen: BLOOD  Result Value Ref Range   Specimen Description      BLOOD RIGHT WRIST Performed at [facility], [address], [place], [place] [address]    Special Requests      BOTTLES DRAWN AEROBIC ONLY Blood Culture results may not be optimal due to an inadequate volume of blood received in culture bottles Performed at [facility], [address] [place], [place] [address]    Culture      NO GROWTH < 12 HOURS Performed at [facility], [address] [place], [place] [address]    Report Status PENDING   Troponin I (High Sensitivity)     Status: Abnormal   Collection Time: 05/16/21  7:31 PM  Result Value Ref Range   Troponin I (High Sensitivity) 22 (H) <18 ng/L  D-dimer, quantitative     Status: Abnormal   Collection Time: 05/16/21  7:31 PM  Result Value Ref Range   D-Dimer, Quant 1.52 (H) 0.00 - 0.50 ug/mL-FEU  Lactic acid, plasma     Status: None   Collection Time: 05/16/21  7:33 PM  Result Value Ref Range   Lactic Acid, Venous 1.8 0.5 - 1.9 mmol/L  MRSA Next Gen by PCR, Nasal     Status: None   Collection Time: 05/16/21  7:41 PM   Specimen: Nasal Mucosa; Nasal Swab  Result Value Ref Range   MRSA by PCR Next Gen NOT DETECTED NOT DETECTED  Troponin I (High Sensitivity)     Status: Abnormal   Collection Time: 05/16/21  9:35 PM  Result Value Ref Range   Troponin I (High Sensitivity) 28 (H) <18 ng/L  Urinalysis, Routine w reflex microscopic Urine, Clean Catch     Status: Abnormal   Collection Time: 05/17/21  1:30 AM  Result Value Ref Range   Color, Urine YELLOW YELLOW   APPearance CLEAR CLEAR   Specific Gravity, Urine 1.010 1.005 - 1.030   pH 6.0 5.0 - 8.0   Glucose, UA NEGATIVE NEGATIVE mg/dL   Hgb urine dipstick LARGE (A) NEGATIVE   Bilirubin Urine NEGATIVE NEGATIVE   Ketones, ur NEGATIVE NEGATIVE mg/dL   Protein, ur NEGATIVE NEGATIVE mg/dL   Nitrite NEGATIVE NEGATIVE   Leukocytes,Ua NEGATIVE NEGATIVE   RBC / HPF 0-5 0 - 5 RBC/hpf   WBC, UA 0-5 0 - 5 WBC/hpf   Bacteria, UA NONE SEEN NONE SEEN   Squamous Epithelial / LPF 0-5 0 - 5   Mucus PRESENT   CBC     Status: Abnormal   Collection Time: 05/17/21  3:02 AM  Result Value Ref Range   WBC 13.5 (H) 4.0 - 10.5 K/uL   RBC 2.49 (L) 3.87 - 5.11 MIL/uL   Hemoglobin 7.1 (L) 12.0 - 15.0 g/dL   HCT 37.6 (L) 28.3 - 15.1 %  MCV 89.6 80.0 - 100.0 fL   MCH 28.5 26.0 - 34.0 pg   MCHC 31.8 30.0 - 36.0 g/dL   RDW 01.7 79.3 - 90.3 %   Platelets 315 150 - 400 K/uL   nRBC 0.0  0.0 - 0.2 %  Comprehensive metabolic panel     Status: Abnormal   Collection Time: 05/17/21  3:02 AM  Result Value Ref Range   Sodium 136 135 - 145 mmol/L   Potassium 3.9 3.5 - 5.1 mmol/L   Chloride 106 98 - 111 mmol/L   CO2 23 22 - 32 mmol/L   Glucose, Bld 103 (H) 70 - 99 mg/dL   BUN 18 8 - 23 mg/dL   Creatinine, Ser 0.09 0.44 - 1.00 mg/dL   Calcium 7.1 (L) 8.9 - 10.3 mg/dL   Total Protein 5.4 (L) 6.5 - 8.1 g/dL   Albumin 2.3 (L) 3.5 - 5.0 g/dL   AST 233 (H) 15 - 41 U/L   ALT 306 (H) 0 - 44 U/L   Alkaline Phosphatase 51 38 - 126 U/L   Total Bilirubin 0.8 0.3 - 1.2 mg/dL   GFR, Estimated >00 >76 mL/min   Anion gap 7 5 - 15  Lactic acid, plasma     Status: None   Collection Time: 05/17/21  3:02 AM  Result Value Ref Range   Lactic Acid, Venous 1.1 0.5 - 1.9 mmol/L  Glucose, capillary     Status: None   Collection Time: 05/17/21  7:18 AM  Result Value Ref Range   Glucose-Capillary 87 70 - 99 mg/dL    ASSESSMENT: Jeanette Bennett is a 70 y.o. female, 3 Days Post-Op s/p RIGHT TOTAL HIP ARTHROPLASTY  CV/Blood loss: Acute blood loss anemia, Hgb 7.1 this AM. Hemodynamically stable. Continue to monitor  PLAN: Weightbearing: WBAT RLE Incisional and dressing care:  Change PRN Showering: Bed bath for now. Aquacel dressing may get wet   Orthopedic device(s):  Knee immobilizer while in bed   Pain management: Try to minimize narcotic use 1. Oxycodone 5 mg q 6 hours PRN 2. Neurontin 100 mg BID 3. Morphine 2 mg q 2 hours PRN VTE prophylaxis: Aspirin 81 mg currently, SCDs Foley/Lines:  No foley, KVO IVFs Dispo: Continue PT/OT as tolerated. Appreciate assistance from CCM. Ok to start chemical DVT prophylaxis from ortho standpoint. Continue to monitor CBC, may require blood transfusion if Hgb continues to drop.   Follow - up plan: 1-2 weeks with Dr. Simonne Come information:  Truitt Merle MD, Thyra Breed PA-C. After hours and holidays please check [email or web address] for group call  information for [facility]   Thompson Caul, PA-C [phone] (office) [email or web address]

## 2021-05-17 NOTE — Consult Note (Signed)
NAME:  Jeanette Bennett, MRN:  [id number], DOB:  [birth year], LOS: 0 ADMISSION DATE:  05/14/2021, CONSULTATION DATE: 05/16/2021 REFERRING MD: Dr. Madelon Lips, CHIEF COMPLAINT: Hypoxemia, unresponsiveness  History of Present Illness:  70 year old woman with a history of hypertension, GERD, anxiety/depression, anemia and osteoarthritis.  She underwent a posterior approach right total hip surgery on 05/14/2021.  Some hypotension over the last 24 hours, BP meds were held this morning 12/25.  She has had some progressive lethargy through the day 12/25, did work with PT around 1300.  Husband at bedside noted her to be sleeping heavily around 1400.  She reportedly received Dilaudid around 7:40AM, oxycodone 10 mg at 418 and 1020.  She does not have a known history of OSA.  She was found unresponsive around 6 PM would not wake to voice or sternal rub.  Oxygen saturations in 50's% on RA.  Question some short-lived seizure activity, apparently bit her tongue.  Oxygen placed and Narcan given and she moved to the ICU urgently.   Pertinent  Medical History   Past Medical History:  Diagnosis Date   Anemia    Anxiety    Arthritis    Depression    GERD (gastroesophageal reflux disease)    Hypertension    Tuberculosis     Significant Hospital Events: Including procedures, antibiotic start and stop dates in addition to other pertinent events   Right hip replacement 12/23 12/25 found hypoxic and unresponsive on RA. Possible seizure activity 12/25 ECG > irregular baseline but looks sinus, no ST changes.  12/25 CT angio head and neck.  No large vessel occlusion, no evidence of CVA   Interim History / Subjective:   Mental status has improved, remains unclear about events overnight-still not fully oriented Having significant right hip pain which is impacting No further seizure activity reported Head CT reassuring as above Troponin 22 > 28  Objective   Blood pressure 136/78, pulse 91, temperature  98.1 F (36.7 C), temperature source Oral, resp. rate 12, height 5\' 11"  (1.803 m), weight 103.4 kg, SpO2 100 %.        Intake/Output Summary (Last 24 hours) at 05/17/2021 0949 Last data filed at 05/17/2021 0935 Gross per 24 hour  Intake 3489.13 ml  Output 1200 ml  Net 2289.13 ml   Filed Weights   05/14/21 0808 05/14/21 1733  Weight: 103.4 kg 103.4 kg    Examination: General: obese woman, laying in bed.  No distress but clearly uncomfortable from hip pain HENT: Oropharynx clear.  Evidence for left anterior tongue laceration but no active bleeding Lungs: Decreased at both bases but clear bilaterally.  No stridor Cardiovascular: Distant, regular Abdomen: Obese, nondistended, positive bowel sounds Extremities: Edema.  Pain right lower extremity Neuro: Awake, answers questions, follows commands.  Oriented to self, place.  Moves all extremities.  No tremor GU: Deferred  Resolved Hospital Problem list     Assessment & Plan:   Acute hypoxemic respiratory failure.  Transient and resolved with placement of nasal cannula oxygen.  Overall quickly improved.  Suspect this was due to the impact of her narcotics.  Doubt pulmonary embolism at this point.  D-dimer 1.52 -Pulmonary hygiene -Attempted minimize narcotics but she will need coverage because she is having significant hip discomfort -Question whether we can start chemical DVT prophylaxis.  Will ask Ortho -Wean oxygen for SPO2 90%, hopefully to off -Body habitus would be consistent with OSA.  Could consider empiric nocturnal CPAP  Acute encephalopathy.  Suspect a component of medication effect  from her narcotics.  Concerning also for possible hypoxemic injury.  No evidence for acute hypercapnia on her ABG at time of transfer.  There was approximately 5 hours between when she worked with PT and when she was found unresponsive.  Saturations at that time in the 67s.  Now awake.  Consider postictal state as there was a period of witnessed  activity suspicious for seizures.  Resolved on its own. -Maintain adequate oxygenation -Follow mental status for improvement -Careful with sedating medications, narcotics although she will need some -Appreciate neurology assistance.  Loaded with Keppra, have not continued as suspect that any seizure activity was precipitated by her acute hypoxemia.  Ativan available if recurs -May require EEG, defer to neuro plans  Transaminitis -Check right upper quadrant ultrasound -Suspect this was due to hypoperfusion/hypoxemia  Tongue laceration -Some dried blood in the oropharynx, does not appear to be active currently -Follow CBC  History of hypertension, relative hypotension/normotension over last 24 hours -Continue to hold Maxide, Avapro, Norvasc for now.  Will restart as her blood pressure rebounds.  Suspect a component of narcotic effect -ECG, troponins reassuring  Anxiety depression -Continue Cymbalta, Neurontin  Hyperlipidemia -Continue atorvastatin   Best Practice (right click and "Reselect all SmartList Selections" daily)   Diet/type: NPO DVT prophylaxis: SCD GI prophylaxis: PPI Lines: N/A Foley:  N/A Code Status:  full code Last date of multidisciplinary goals of care discussion [Full code confirmed 12/25] Discussed with husband 12/26  Labs   CBC: Recent Labs  Lab 05/15/21 0327 05/16/21 0729 05/16/21 1931 05/17/21 0302  WBC 12.3* 14.0* 14.3* 13.5*  NEUTROABS 9.5* 10.9*  --   --   HGB 8.2* 8.1* 7.4* 7.1*  HCT 26.1* 25.3* 23.3* 22.3*  MCV 89.1 90.4 90.3 89.6  PLT 336 347 308 315    Basic Metabolic Panel: Recent Labs  Lab 05/15/21 0327 05/16/21 1931 05/17/21 0302  NA 136 132* 136  K 3.8 4.3 3.9  CL 101 106 106  CO2 27 22 23   GLUCOSE 124* 139* 103*  BUN 16 21 18   CREATININE 1.04* 0.97 0.83  CALCIUM 7.8* 7.0* 7.1*   GFR: Estimated Creatinine Clearance: 81.1 mL/min (by C-G formula based on SCr of 0.83 mg/dL). Recent Labs  Lab 05/15/21 0327  05/16/21 0729 05/16/21 1931 05/16/21 1933 05/17/21 0302  WBC 12.3* 14.0* 14.3*  --  13.5*  LATICACIDVEN  --   --   --  1.8 1.1    Liver Function Tests: Recent Labs  Lab 05/17/21 0302  AST 593*  ALT 306*  ALKPHOS 51  BILITOT 0.8  PROT 5.4*  ALBUMIN 2.3*   No results for input(s): LIPASE, AMYLASE in the last 168 hours. No results for input(s): AMMONIA in the last 168 hours.  ABG    Component Value Date/Time   PHART 7.316 (L) 05/16/2021 1821   PCO2ART 44.7 05/16/2021 1821   PO2ART 92.7 05/16/2021 1821   HCO3 22.2 05/16/2021 1821   ACIDBASEDEF 3.2 (H) 05/16/2021 1821   O2SAT 96.3 05/16/2021 1821     Coagulation Profile: No results for input(s): INR, PROTIME in the last 168 hours.  Cardiac Enzymes: No results for input(s): CKTOTAL, CKMB, CKMBINDEX, TROPONINI in the last 168 hours.  HbA1C: No results found for: HGBA1C  CBG: Recent Labs  Lab 05/16/21 1742 05/16/21 1753 05/17/21 0718  GLUCAP 140* 123* 87     Critical care time: 33 minutes     05/18/21, MD, PhD 05/17/2021, 9:49 AM [facility] [phone] or  if no answer before 7:00PM call [phone] For any issues after 7:00PM please call eLink [phone]

## 2021-05-17 NOTE — Progress Notes (Signed)
EEG complete - results pending 

## 2021-05-17 NOTE — Progress Notes (Signed)
eLink Physician-Brief Progress Note Patient Name: Jeanette Bennett Jeanette Bennett DOB: [birth year] MRN: [id number]   Date of Service  05/17/2021  HPI/Events of Note  Patient now awake and is complaining of hip pain despite Tylenol BP 111/55  HR 94  O2 99% Creatinine 0.83  eICU Interventions  Ordered a one time dose of ketorolac for pain. Avoiding sedating medications     Intervention Category Minor Interventions: Routine modifications to care plan (e.g. PRN medications for pain, fever)  Rosalie Gums [place] 05/17/2021, 6:46 AM

## 2021-05-17 NOTE — TOC Initial Note (Signed)
Transition of Care [facility]) - Initial/Assessment Note    Patient Details  Name: Jeanette Bennett MRN: [id number] Date of Birth: Feb 17, 1951  [facility]) CM/SW Contact:    Golda Acre, RN Phone Number: 05/17/2021, 7:57 AM  Clinical Narrative:                 Pt with rapid response on 122522 due to opiates and found unrepsonsive. Given narcan and transferred to icu. Plan: to stablize patient and send home with dme .  Expected Discharge Plan: Home/Self Care Barriers to Discharge: Continued Medical Work up   Patient Goals and CMS Choice   CMS Medicare.gov Compare Post Acute Care list provided to:: Patient    Expected Discharge Plan and Services Expected Discharge Plan: Home/Self Care   Discharge Planning Services: CM Consult   Living arrangements for the past 2 months: Single Family Home Expected Discharge Date: 05/14/21                                    Prior Living Arrangements/Services Living arrangements for the past 2 months: Single Family Home Lives with:: Spouse Patient language and need for interpreter reviewed:: Yes Do you feel safe going back to the place where you live?: Yes            Criminal Activity/Legal Involvement Pertinent to Current Situation/Hospitalization: No - Comment as needed  Activities of Daily Living Home Assistive Devices/Equipment: Blood pressure cuff, Eyeglasses, Cane (specify quad or straight), Walker (specify type), Wheelchair, Bedside commode/3-in-1, Dentures (specify type), Built-in shower seat, Grab bars in shower, Hand-held shower hose ADL Screening (condition at time of admission) Patient's cognitive ability adequate to safely complete daily activities?: Yes Is the patient deaf or have difficulty hearing?: No Does the patient have difficulty seeing, even when wearing glasses/contacts?: No Does the patient have difficulty concentrating, remembering, or making decisions?: No Patient able to express  need for assistance with ADLs?: Yes Does the patient have difficulty dressing or bathing?: Yes Independently performs ADLs?: No Does the patient have difficulty walking or climbing stairs?: Yes Weakness of Legs: Both Weakness of Arms/Hands: Both  Permission Sought/Granted                  Emotional Assessment   Attitude/Demeanor/Rapport: Engaged Affect (typically observed): Calm Orientation: : Oriented to Place, Oriented to Self, Oriented to  Time, Oriented to Situation Alcohol / Substance Use: Tobacco Use Psych Involvement: No (comment)  Admission diagnosis:  Degenerative joint disease of right hip [M16.11] Patient Active Problem List   Diagnosis Date Noted   Degenerative joint disease of right hip 05/14/2021   Chronic right hip pain 05/03/2021   Primary localized osteoarthritis of right hip 05/03/2021   PCP:  Lorelei Pont, DO Pharmacy:   [id number] - [place], [place] - [address] [place] [address] Phone: [phone] Fax: [phone]     Social Determinants of Health (SDOH) Interventions    Readmission Risk Interventions No flowsheet data found.

## 2021-05-17 NOTE — Progress Notes (Signed)
Physical Therapy Treatment Patient Details Name: Jeanette Bennett MRN: [id number] DOB: September 16, 1950 Today's Date: 05/17/2021   History of Present Illness Patient is 70 y.o. female s/p Rt THA posterior approach on 05/14/21 with pMH significant for anemia, OA, anxiety, depression, HTN, GERD, Rt RCR.    PT Comments    Pt to ICU overnight and quite lethargic but with increased time and multimodal cues able to participate 50% with bed therex program.  OOB deferred 2* pt lethargic state.   Recommendations for follow up therapy are one component of a multi-disciplinary discharge planning process, led by the attending physician.  Recommendations may be updated based on patient status, additional functional criteria and insurance authorization.  Follow Up Recommendations  Follow physician's recommendations for discharge plan and follow up therapies     Assistance Recommended at Discharge Frequent or constant Supervision/Assistance  Equipment Recommendations  Rolling walker (2 wheels);BSC/3in1    Recommendations for Other Services OT consult     Precautions / Restrictions Precautions Precautions: Fall Precaution Comments: posterior hip precautions Restrictions Weight Bearing Restrictions: No Other Position/Activity Restrictions: WBAT     Mobility  Bed Mobility               General bed mobility comments: OOB deferred 2* pt lethargy    Transfers                        Ambulation/Gait                   Stairs             Wheelchair Mobility    Modified Rankin (Stroke Patients Only)       Balance                                            Cognition Arousal/Alertness: Awake/alert Behavior During Therapy: WFL for tasks assessed/performed Overall Cognitive Status: Within Functional Limits for tasks assessed                                          Exercises Total Joint Exercises Ankle  Circles/Pumps: AROM;15 reps;Supine;Both;AAROM Quad Sets: Both;10 reps;Supine;AAROM;AROM Heel Slides: AAROM;Right;20 reps;Supine Hip ABduction/ADduction: AAROM;Right;15 reps;Supine    General Comments        Pertinent Vitals/Pain Pain Assessment: Faces Faces Pain Scale: Hurts little more Breathing: normal Negative Vocalization: none Facial Expression: smiling or inexpressive Body Language: relaxed Consolability: no need to console PAINAD Score: 0 Pain Location: Rt hip Pain Descriptors / Indicators: Grimacing Pain Intervention(s): Limited activity within patient's tolerance;Monitored during session;Ice applied    Home Living                          Prior Function            PT Goals (current goals can now be found in the care plan section) Acute Rehab PT Goals Patient Stated Goal: No goals expressed this date PT Goal Formulation: With patient Time For Goal Achievement: 05/21/21 Potential to Achieve Goals: Good Progress towards PT goals: Not progressing toward goals - comment (Transferred to ICU overnight - pt quite lethargic)    Frequency    7X/week      PT Plan Current plan remains  appropriate    Co-evaluation              AM-PAC PT "6 Clicks" Mobility   Outcome Measure  Help needed turning from your back to your side while in a flat bed without using bedrails?: Total Help needed moving from lying on your back to sitting on the side of a flat bed without using bedrails?: Total Help needed moving to and from a bed to a chair (including a wheelchair)?: Total Help needed standing up from a chair using your arms (e.g., wheelchair or bedside chair)?: Total Help needed to walk in hospital room?: Total Help needed climbing 3-5 steps with a railing? : Total 6 Click Score: 6    End of Session   Activity Tolerance: Patient limited by lethargy;Patient limited by fatigue Patient left: in bed;with call bell/phone within reach;with bed alarm set;with  nursing/sitter in room Nurse Communication: Mobility status PT Visit Diagnosis: Muscle weakness (generalized) (M62.81);Unsteadiness on feet (R26.81);Difficulty in walking, not elsewhere classified (R26.2);Pain Pain - Right/Left: Right Pain - part of body: Hip     Time: 1050-1104 PT Time Calculation (min) (ACUTE ONLY): 14 min  Charges:  $Therapeutic Exercise: 8-22 mins                     Mauro Kaufmann PT Acute Rehabilitation Services Pager [phone] Office [phone]    Tyleigh Mahn 05/17/2021, 12:52 PM

## 2021-05-17 NOTE — Procedures (Signed)
Patient Name: Jeanette Bennett  MRN: [id number]  Epilepsy Attending: Charlsie Quest  Referring Physician/Provider: Erick Blinks, MD Date: 05/17/2021 Duration: 23.08 mins  Patient history: 70yo F with ams. EEG to evaluate for seizure.   Level of alertness: Awake  AEDs during EEG study: None  Technical aspects: This EEG study was done with scalp electrodes positioned according to the 10-20 International system of electrode placement. Electrical activity was acquired at a sampling rate of 500Hz  and reviewed with a high frequency filter of 70Hz  and a low frequency filter of 1Hz . EEG data were recorded continuously and digitally stored.   Description: EEG showed continuous generalized 3 to 6 Hz theta-delta slowing, at times with triphasic morphology. Hyperventilation and photic stimulation were not performed.     ABNORMALITY - Continuous slow, generalized  IMPRESSION: This study is suggestive of moderate diffuse encephalopathy, nonspecific etiology but could be secondary to toxic-metabolic causes. No seizures or definite epileptiform discharges were seen throughout the recording.  Gloris Shiroma 

## 2021-05-18 ENCOUNTER — Encounter (HOSPITAL_COMMUNITY): Payer: Self-pay | Admitting: Orthopedic Surgery

## 2021-05-18 DIAGNOSIS — M1611 Unilateral primary osteoarthritis, right hip: Principal | ICD-10-CM

## 2021-05-18 DIAGNOSIS — G9340 Encephalopathy, unspecified: Secondary | ICD-10-CM | POA: Diagnosis not present

## 2021-05-18 DIAGNOSIS — R569 Unspecified convulsions: Secondary | ICD-10-CM

## 2021-05-18 DIAGNOSIS — J9601 Acute respiratory failure with hypoxia: Secondary | ICD-10-CM | POA: Diagnosis not present

## 2021-05-18 LAB — COMPREHENSIVE METABOLIC PANEL
ALT: 293 U/L — ABNORMAL HIGH (ref 0–44)
AST: 294 U/L — ABNORMAL HIGH (ref 15–41)
Albumin: 2.6 g/dL — ABNORMAL LOW (ref 3.5–5.0)
Alkaline Phosphatase: 69 U/L (ref 38–126)
Anion gap: 9 (ref 5–15)
BUN: 11 mg/dL (ref 8–23)
CO2: 22 mmol/L (ref 22–32)
Calcium: 7.3 mg/dL — ABNORMAL LOW (ref 8.9–10.3)
Chloride: 106 mmol/L (ref 98–111)
Creatinine, Ser: 0.71 mg/dL (ref 0.44–1.00)
GFR, Estimated: >60 mL/min (ref >60)
Glucose, Bld: 84 mg/dL (ref 70–99)
Potassium: 3.9 mmol/L (ref 3.5–5.1)
Sodium: 137 mmol/L (ref 135–145)
Total Bilirubin: 0.8 mg/dL (ref 0.3–1.2)
Total Protein: 6.4 g/dL — ABNORMAL LOW (ref 6.5–8.1)

## 2021-05-18 LAB — TSH: TSH: 3.29 u[IU]/mL (ref 0.350–4.500)

## 2021-05-18 LAB — CBC
HCT: 24.4 % — ABNORMAL LOW (ref 36.0–46.0)
Hemoglobin: 7.5 g/dL — ABNORMAL LOW (ref 12.0–15.0)
MCH: 27.9 pg (ref 26.0–34.0)
MCHC: 30.7 g/dL (ref 30.0–36.0)
MCV: 90.7 fL (ref 80.0–100.0)
Platelets: 398 10*3/uL (ref 150–400)
RBC: 2.69 MIL/uL — ABNORMAL LOW (ref 3.87–5.11)
RDW: 15.2 % (ref 11.5–15.5)
WBC: 17.4 10*3/uL — ABNORMAL HIGH (ref 4.0–10.5)
nRBC: 0.1 % (ref 0.0–0.2)

## 2021-05-18 LAB — PROTIME-INR
INR: 1.1 (ref 0.8–1.2)
Prothrombin Time: 13.8 seconds (ref 11.4–15.2)

## 2021-05-18 LAB — BLOOD GAS, ARTERIAL
Acid-base deficit: 2.8 mmol/L — ABNORMAL HIGH (ref 0.0–2.0)
Bicarbonate: 22.5 mmol/L (ref 20.0–28.0)
O2 Content: 4 L/min
O2 Saturation: 93.6 %
Patient temperature: 98.6
pCO2 arterial: 43.8 mmHg (ref 32.0–48.0)
pH, Arterial: 7.331 — ABNORMAL LOW (ref 7.350–7.450)
pO2, Arterial: 74.3 mmHg — ABNORMAL LOW (ref 83.0–108.0)

## 2021-05-18 LAB — AMMONIA: Ammonia: 12 umol/L (ref 9–35)

## 2021-05-18 LAB — SURGICAL PATHOLOGY

## 2021-05-18 LAB — MAGNESIUM: Magnesium: 1.9 mg/dL (ref 1.7–2.4)

## 2021-05-18 MED ORDER — FENTANYL CITRATE (PF) 100 MCG/2ML IJ SOLN
12.5000 ug | INTRAMUSCULAR | Status: DC | PRN
Start: 2021-05-18 — End: 2021-05-20
  Administered 2021-05-18: 21:00:00 50 ug via INTRAVENOUS
  Administered 2021-05-18: 19:00:00 25 ug via INTRAVENOUS
  Administered 2021-05-18 – 2021-05-20 (×10): 50 ug via INTRAVENOUS
  Filled 2021-05-18 (×12): qty 2

## 2021-05-18 MED ORDER — HEPARIN SODIUM (PORCINE) 5000 UNIT/ML IJ SOLN
5000.0000 [IU] | Freq: Three times a day (TID) | INTRAMUSCULAR | Status: DC
  Administered 2021-05-18 – 2021-05-23 (×16): 5000 [IU] via SUBCUTANEOUS
  Filled 2021-05-18 (×16): qty 1

## 2021-05-18 MED ORDER — THIAMINE HCL 100 MG/ML IJ SOLN
500.0000 mg | Freq: Every day | INTRAVENOUS | Status: DC
  Administered 2021-05-18: 16:00:00 500 mg via INTRAVENOUS
  Filled 2021-05-18: qty 5

## 2021-05-18 MED ORDER — MORPHINE SULFATE (PF) 2 MG/ML IV SOLN
1.0000 mg | INTRAVENOUS | Status: DC | PRN
  Administered 2021-05-18: 10:00:00 1 mg via INTRAVENOUS
  Filled 2021-05-18: qty 1

## 2021-05-18 MED ORDER — LEVETIRACETAM IN NACL 500 MG/100ML IV SOLN
500.0000 mg | Freq: Two times a day (BID) | INTRAVENOUS | Status: DC
  Administered 2021-05-18 – 2021-05-22 (×9): 500 mg via INTRAVENOUS
  Filled 2021-05-18 (×9): qty 100

## 2021-05-18 MED ORDER — MAGNESIUM SULFATE 2 GM/50ML IV SOLN
2.0000 g | Freq: Once | INTRAVENOUS | Status: AC
  Administered 2021-05-18: 15:00:00 2 g via INTRAVENOUS
  Filled 2021-05-18: qty 50

## 2021-05-18 MED ORDER — THIAMINE HCL 100 MG PO TABS
100.0000 mg | ORAL_TABLET | Freq: Every day | ORAL | Status: DC
  Filled 2021-05-18: qty 1

## 2021-05-18 MED ORDER — FENTANYL CITRATE (PF) 100 MCG/2ML IJ SOLN: 12.5000 ug | INTRAMUSCULAR | Status: DC | PRN

## 2021-05-18 MED ORDER — THIAMINE HCL 100 MG/ML IJ SOLN
500.0000 mg | Freq: Three times a day (TID) | INTRAVENOUS | Status: AC
  Administered 2021-05-18 – 2021-05-21 (×9): 500 mg via INTRAVENOUS
  Filled 2021-05-18 (×10): qty 5

## 2021-05-18 NOTE — Consult Note (Signed)
NEURO HOSPITALIST CONSULT NOTE   Requestig physician: Dr. Delton Coombes  Reason for Consult: Seizure-like spell after hip operation  History obtained from:  Husband and Chart     HPI:                                                                                                                                          Jeanette Bennett is an 70 y.o. female with a PMHx of anemia, HTN, anxiety, depression and osteoarthritis who is s/p right total hip surgery on 12/23. She had some progressive lethargy Sunday and was found unresponsive at about 6 PM Sunday, not waking to voice or sternal rub. Her O2 sats were found to be in the 50's% at that time and she was transferred to the ICU. This was in the context of opiate medications Dilaudid and oxycodone, as well as her OSA which could interact with these meds to further lower O2 saturations. She also had some short-lived seizure activity and apparently bit her tongue. Neurology was consulted for possible seizure and waxing/waning mentation.   Past Medical History:  Diagnosis Date   Anemia    Anxiety    Arthritis    Depression    GERD (gastroesophageal reflux disease)    Hypertension    Tuberculosis     Past Surgical History:  Procedure Laterality Date   ABDOMINAL HYSTERECTOMY     APPENDECTOMY     LAMINECTOMY     ROTATOR CUFF REPAIR Right    TONSILLECTOMY     WISDOM TOOTH EXTRACTION      History reviewed. No pertinent family history.            Social History:  reports that she has quit smoking. Her smoking use included cigarettes. She has never used smokeless tobacco. She reports current alcohol use. She reports current drug use. Frequency: 1.00 time per week. Drug: Marijuana.  Allergies  Allergen Reactions   Other Shortness Of Breath    Tree Nuts   Sulfa Antibiotics Nausea And Vomiting and Rash    MEDICATIONS:                                                                                                                      Prior to Admission:  Medications Prior to  Admission  Medication Sig Dispense Refill Last Dose   acetaminophen (TYLENOL) 650 MG CR tablet Take 1,300 mg by mouth every 8 (eight) hours as needed for pain.   05/13/2021   alendronate (FOSAMAX) 70 MG tablet Take 70 mg by mouth every Monday. Take with a full glass of water on an empty stomach.   Past Week   amLODipine (NORVASC) 10 MG tablet Take 10 mg by mouth daily.   05/13/2021   atorvastatin (LIPITOR) 80 MG tablet Take 40 mg by mouth daily.   05/13/2021   calcium carbonate (TUMS - DOSED IN MG ELEMENTAL CALCIUM) 500 MG chewable tablet Chew 1,500 mg by mouth 3 (three) times daily as needed for indigestion or heartburn.   Past Week   colchicine 0.6 MG tablet Take 0.6-1.2 mg by mouth See admin instructions. Take 1.2 mg at onset of gout flare then take 0.6 mg 1 hour later as needed for gout   Past Month   diclofenac Sodium (VOLTAREN) 1 % GEL Apply 1 application topically 4 (four) times daily.   Past Week   DULoxetine (CYMBALTA) 30 MG capsule Take 30 mg by mouth daily.   05/13/2021   esomeprazole (NEXIUM) 40 MG capsule Take 40 mg by mouth daily.   05/13/2021   gabapentin (NEURONTIN) 100 MG capsule Take 100 mg by mouth 2 (two) times daily.   05/13/2021   loratadine (CLARITIN) 10 MG tablet Take 10 mg by mouth daily.   05/13/2021   Multiple Vitamin (MULTIVITAMIN WITH MINERALS) TABS tablet Take 1 tablet by mouth daily.   Past Week   Nebivolol HCl 20 MG TABS Take 20 mg by mouth daily.   05/13/2021   potassium chloride (KLOR-CON M) 10 MEQ tablet Take 10 mEq by mouth daily.   05/13/2021   triamterene-hydrochlorothiazide (MAXZIDE) 75-50 MG tablet Take 1 tablet by mouth daily.   05/13/2021   valsartan (DIOVAN) 160 MG tablet Take 160 mg by mouth daily.   05/13/2021   Vitamin D, Ergocalciferol, (DRISDOL) 1.25 MG (50000 UNIT) CAPS capsule Take 50,000 Units by mouth every Monday.   Past Week   Scheduled:  aspirin  81 mg Oral BID   atorvastatin  40 mg  Oral Daily   chlorhexidine  15 mL Mouth Rinse BID   Chlorhexidine Gluconate Cloth  6 each Topical Daily   docusate sodium  100 mg Oral BID   DULoxetine  30 mg Oral Daily   gabapentin  100 mg Oral BID   mouth rinse  15 mL Mouth Rinse q12n4p   pantoprazole  80 mg Oral Q1200   potassium chloride  10 mEq Oral Daily   Continuous:  sodium chloride Stopped (05/17/21 1531)   sodium chloride       ROS:  Unable to obtain due to AMS.    Blood pressure (!) 152/87, pulse (!) 104, temperature 98.5 F (36.9 C), temperature source Axillary, resp. rate 13, height  (1.803 m), weight 103.4 kg, SpO2 95 %.   General Examination:                                                                                                       Physical Exam  HEENT-  /AT. Neck is supple.    Lungs- Sonorous respirations Extremities- Warm and well perfused  Neurological Examination Mental Status: Obtunded. Will murmur in discomfort to sternal rub and brow-ridge pressure. Does not answer questions or follow commands. Will move upper and lower extremities slightly in response to noxious stimuli, without asymmetry. Does not gaze towards or away from visual stimuli but will close eyes to penlight. Grimaces no noxious.  Cranial Nerves: II: Blinks to threat inconsistently OU. PERRL.   III,IV, VI: Does not gaze towards or away from visual stimuli. Eyes are conjugate near the midline. Partially suppressed oculocephalic reflex.  V: Grimaces to brow ridge pressure bilaterally VII: Grimaces and furrows brow symmetrically. VIII: Not responding to auditory stimuli IX,X: Unable to visualize palate. Gag reflex deferred.  XI: Head is midline XII: Does not follow commands for testing.  Motor/Sensory: Will resist examiner intermittently when attempting to elevate each upper extremity and  both lower extremities, without asymmetry. Slight movement to pinch BUE. Weak withdrawal and nonpurposeful movements of BLE to plantar stimulation. Tone decreased x 4.  Deep Tendon Reflexes: 1+ bilateral brachioradialis and biceps Plantars: Right: downgoing   Left: downgoing Cerebellar/Gait: Unable to assess Other: No jerking, twitching or other seizure-like activity noted.    Lab Results: Basic Metabolic Panel: Recent Labs  Lab 05/15/21 0327 05/16/21 1931 05/17/21 0302  NA 136 132* 136  K 3.8 4.3 3.9  CL 101 106 106  CO2 GLUCOSE 124* 139* 103*  BUN CREATININE 1.04* 0.97 0.83  CALCIUM 7.8* 7.0* 7.1*    CBC: Recent Labs  Lab 05/15/21 0327 05/16/21 0729 05/16/21 1931 05/17/21 0302  WBC 12.3* 14.0* 14.3* 13.5*  NEUTROABS 9.5* 10.9*  --   --   HGB 8.2* 8.1* 7.4* 7.1*  HCT 26.1* 25.3* 23.3* 22.3*  MCV 89.1 90.4 90.3 89.6  PLT 336 347 308 315    Cardiac Enzymes: No results for input(s): CKTOTAL, CKMB, CKMBINDEX, TROPONINI in the last 168 hours.  Lipid Panel: No results for input(s): CHOL, TRIG, HDL, CHOLHDL, VLDL, LDLCALC in the last 168 hours.  Imaging: CT ANGIO HEAD NECK W WO CM  Result Date: 05/16/2021 CLINICAL DATA:  Neuro deficit, acute, stroke suspected. Sudden onset of unresponsiveness. EXAM: CT ANGIOGRAPHY HEAD AND NECK TECHNIQUE: Multidetector CT imaging of the head and neck was performed using the standard protocol during bolus administration of intravenous contrast. Multiplanar CT image reconstructions and MIPs were obtained to evaluate the vascular anatomy. Carotid stenosis measurements (when applicable) are obtained utilizing NASCET criteria, using the distal internal carotid diameter as the denominator. CONTRAST:  80mL OMNIPAQUE IOHEXOL 350  MG/ML SOLN COMPARISON:  None. FINDINGS: CT HEAD FINDINGS Brain: Age related atrophy. Chronic small-vessel ischemic change of the white matter. No sign of acute infarction, mass lesion, hemorrhage,  hydrocephalus or extra-axial collection. Vascular: There is atherosclerotic calcification of the major vessels at the base of the brain. Skull: Negative Sinuses: Clear/normal Orbits: None Review of the MIP images confirms the above findings CTA NECK FINDINGS Aortic arch: Aortic atherosclerosis.  Branching pattern is normal. Right carotid system: Common carotid artery widely patent to the bifurcation. Carotid bifurcation is normal. Cervical ICA is normal. Left carotid system: Common carotid artery widely patent to the bifurcation. Mild calcified plaque at the bifurcation and ICA bulb but no stenosis. Cervical ICA normal beyond that. Vertebral arteries: Both vertebral artery origins are normal. Both vertebral arteries are normal through the cervical region to the foramen magnum. Skeleton: Chronic cervical spondylosis.  No acute finding. Other neck: No mass or lymphadenopathy. Upper chest: Question mild interstitial edema. Mild dependent pulmonary atelectasis. Review of the MIP images confirms the above findings CTA HEAD FINDINGS Anterior circulation: Both internal carotid arteries are patent through the skull base and siphon regions. Ordinary siphon atherosclerotic calcification but without stenosis greater than 30%. The anterior and middle cerebral vessels are normal except for a mild stenosis of the distal M1 segment on the right. No large vessel occlusion. No proximal stenosis. No aneurysm or vascular malformation. Posterior circulation: Both vertebral arteries are widely patent through the foramen magnum to the basilar. No basilar stenosis. Posterior circulation branch vessels are patent. Venous sinuses: Patent and normal. Anatomic variants: None significant. Review of the MIP images confirms the above findings IMPRESSION: No large vessel occlusion. No flow limiting atherosclerotic stenotic disease. Mild stenosis of the distal M1 segment on the right. No acute head CT finding. Electronically Signed   By: Paulina Fusi M.D.   On: 05/16/2021 21:08   DG Chest Port 1 View  Result Date: 05/16/2021 CLINICAL DATA:  Acute respiratory failure with hypoxemia EXAM: PORTABLE CHEST 1 VIEW COMPARISON:  None. FINDINGS: Enlarged cardiac silhouette with central vascular prominence. No overt pulmonary edema. Low lung volumes with bibasilar atelectasis. No visible pleural effusion or pneumothorax. No acute osseous abnormality. IMPRESSION: 1. Enlarged cardiac silhouette with central vascular prominence. No overt pulmonary edema. 2. Low lung volumes with bibasilar atelectasis. 3.  Aortic Atherosclerosis (ICD10-I70.0). Electronically Signed   By: Maudry Mayhew M.D.   On: 05/16/2021 19:20   EEG adult  Result Date: 05/17/2021 Charlsie Quest, MD     05/17/2021  4:23 PM Patient Name: Merry Pond MRN: [id number] Epilepsy Attending: Charlsie Quest Referring Physician/Provider: Erick Blinks, MD Date: 05/17/2021 Duration: 23.08 mins Patient history: 70yo F with ams. EEG to evaluate for seizure. Level of alertness: Awake AEDs during EEG study: None Technical aspects: This EEG study was done with scalp electrodes positioned according to the 10-20 International system of electrode placement. Electrical activity was acquired at a sampling rate of 500Hz  and reviewed with a high frequency filter of 70Hz  and a low frequency filter of 1Hz . EEG data were recorded continuously and digitally stored. Description: EEG showed continuous generalized 3 to 6 Hz theta-delta slowing, at times with triphasic morphology. Hyperventilation and photic stimulation were not performed.   ABNORMALITY - Continuous slow, generalized IMPRESSION: This study is suggestive of moderate diffuse encephalopathy, nonspecific etiology but could be secondary to toxic-metabolic causes. No seizures or definite epileptiform discharges were seen throughout the recording. Priyanka   Abdomen Limited RUQ (LIVER/GB)  Result Date: 05/17/2021 CLINICAL  DATA:  Transaminitis. EXAM: ULTRASOUND ABDOMEN LIMITED RIGHT UPPER QUADRANT COMPARISON:  None. FINDINGS: Gallbladder: No gallstones or gallbladder wall thickening. No pericholecystic fluid. The sonographer reports no sonographic Murphy's sign. Common bile duct: Diameter: 3 mm Liver: No focal lesion identified. Within normal limits in parenchymal echogenicity. Portal vein is patent on color Doppler imaging with normal direction of blood flow towards the liver. Other: None. IMPRESSION: Unremarkable right upper quadrant ultrasound. Electronically Signed   By: Kennith Center M.D.   On: 05/17/2021 14:26     Assessment: 70 year old female with seizure-like episode after hip surgery.  1. Exam reveals findings most consistent with a diffuse encephalopathy. DDx includes a hepatic encephalopathy given elevated LFTs, hypoxemia given her low arterial PO2 on ABG drawn 0057 (as well as previous episode of low oxygen saturation for which she had been transferred to the ICU), and infectious encephalopathy given her elevated white count, which has trended upwards.  2. TSH came back normal.  3. EEG with continuous generalized slowing. Findings are suggestive of moderate diffuse encephalopathy, nonspecific to etiology but could be secondary to toxic-metabolic causes. No seizures or definite epileptiform discharges were seen throughout the recording.  Recommendations: 1. Consider lowering her total daily opiate dosage 2. Minimize sedating and deliriogenic medications as much as possible. Consider discontinuing her PRN benadryl.  3. Her Neurontin is at a relatively low dose, so can continue.  4. Ammonia level has been ordered.  5. MRI brain to assess for signal changes which may occur in hepatic encephalopathy.  6. Draw a thiamine level (ordered). Once drawn, start empiric high-dose thiamine at 500 mg IV TID x 3 days, then continue with 100 mg po qd thereafter.  7. Starting Keppra at 500 mg IV BID. Given that the first  time seizure may have been provoked, if she has no further seizures as an inpatient, can consider tapering off Keppra once her delirium clears. 8. Management of oxygen requirements per primary team.    Electronically signed: Dr. Caryl Pina 05/18/2021, 1:52 AM

## 2021-05-18 NOTE — Progress Notes (Signed)
NAME:  Jeanette Bennett, MRN:  [id number], DOB:  12/28/1950, LOS: 1 ADMISSION DATE:  05/14/2021, CONSULTATION DATE: 05/16/2021 REFERRING MD: Dr. Madelon Lips, CHIEF COMPLAINT: Hypoxemia, unresponsiveness  History of Present Illness:  70 year old woman with a history of hypertension, GERD, anxiety/depression, anemia and osteoarthritis.  She underwent a posterior approach right total hip surgery on 05/14/2021.  Some hypotension over the last 24 hours, BP meds were held this morning 12/25.  She has had some progressive lethargy through the day 12/25, did work with PT around 1300.  Husband at bedside noted her to be sleeping heavily around 1400.  She reportedly received Dilaudid around 7:40AM, oxycodone 10 mg at 418 and 1020.  She does not have a known history of OSA.  She was found unresponsive around 6 PM would not wake to voice or sternal rub.  Oxygen saturations in 50's% on RA.  Question some short-lived seizure activity, apparently bit her tongue.  Oxygen placed and Narcan given and she moved to the ICU urgently.   Pertinent  Medical History   Past Medical History:  Diagnosis Date   Anemia    Anxiety    Arthritis    Depression    GERD (gastroesophageal reflux disease)    Hypertension    Tuberculosis     Significant Hospital Events: Including procedures, antibiotic start and stop dates in addition to other pertinent events   Right hip replacement 12/23 12/25 found hypoxic and unresponsive on RA. Possible seizure activity 12/25 ECG > irregular baseline but looks sinus, no ST changes.  12/25 CT angio head and neck.  No large vessel occlusion, no evidence of CVA 12/26 EEG > neg for seizures.  Interim History / Subjective:  NAEON.  EEG was performed on 12/26 and demonstrated moderate nonspecific diffuse encephalopathy with no seizures or definite epileptiform discharges observed.  O2 sat ranges from 70s to 100 though has poor pleth.  Per RN, they have tried multiple sites with same  problem.  ABG 12/27 on 4L O2 showed pO2 74 with sat 94. She is awake but not fully alert and oriented. Moans words, tracks with eyes, speaks 2 - 3 word sentences.  Moves extremities to noxious stimuli.  Objective   Blood pressure 102/63, pulse 97, temperature 99.6 F (37.6 C), temperature source Axillary, resp. rate 15, height 5\' 11"  (1.803 m), weight 103.4 kg, SpO2 98 %.        Intake/Output Summary (Last 24 hours) at 05/18/2021 0738 Last data filed at 05/18/2021 0316 Gross per 24 hour  Intake 1953.78 ml  Output 800 ml  Net 1153.78 ml    Filed Weights   05/14/21 0808 05/14/21 1733  Weight: 103.4 kg 103.4 kg    Examination:  General: obese woman, resting in bed, in NAD. HENT: Oropharynx clear.  Evidence for left anterior tongue laceration but no active bleeding. Lungs: Decreased at both bases but clear bilaterally.  No stridor. Cardiovascular: Distant, regular. Abdomen: Obese, nondistended, positive bowel sounds. Extremities: Edema.  Pain right lower extremity. Right hip dressing C/D/I. Neuro: Eyes open, speaks 2 - 3 word sentences. Moves all extremities. GU: Deferred.  Assessment & Plan:   Acute hypoxemic respiratory failure.  Transient and resolved with placement of nasal cannula oxygen.  Overall quickly improved.  Suspect this was due to the impact of her narcotics.  Doubt pulmonary embolism at this point.  D-dimer 1.52 - Continue supplemental O2 as needed to maintain SpO2 > 92% (though pleth on sat curve is poor and inconsistent). - Continue pulmonary  hygiene. - Continue to minimize narcotic and sedating med usage. She has required some narcotics due to significant hip pain. - Add subQ Heparin for DVT prophylaxis (OK with ortho). - Body habitus would be consistent with OSA.  Could consider empiric nocturnal CPAP.  Acute encephalopathy - Suspect a component of medication effect from her narcotics.  Concerning also for possible hypoxemic injury.  No evidence for acute  hypercapnia on her ABG at time of transfer.  There was approximately 5 hours between when she worked with PT and when she was found unresponsive.  Saturations at that time in the 63s.  Now awake.  Consider postictal state as there was a period of witnessed activity suspicious for seizures.  Resolved on its own.  EEG 12/26 neg.  TSH and CBG normal. - Maintain adequate oxygenation. - Careful with sedating medications, narcotics although she will need some for hip pain. Morphine decreased from 2mg  to 1mg  q2hrs PRN. - D/c Cymbalta, Diphenhydramine. - Appreciate neurology assistance.  Loaded with Keppra 12/26, have not continued as suspect that any seizure activity was precipitated by her acute hypoxemia (EEG 12/26 neg). Ativan available if recurs. - F/u on Ammonia, B12.  Transaminitis - 2/2 hypoperfusion.  RUQ 1/27 12/26 neg.  LFT's improving. - Trend LFT's.  Tongue laceration - appears stable.  - Follow CBC.  History of hypertension, relative hypotension/normotension over last 24 hours - Continue to hold Maxide, Avapro, Norvasc for now.  Will restart as her blood pressure rebounds.  Suspect a component of narcotic effect, ECG, troponins reassuring.  Hx Anxiety, depression. - Hold home Cymbalta. - Continue home Neurontin as might help with discomfort.  Best Practice (right click and "Reselect all SmartList Selections" daily)   Diet/type: Regular consistency (see orders) DVT prophylaxis: prophylactic heparin  GI prophylaxis: PPI Lines: N/A Foley:  N/A Code Status:  full code Last date of multidisciplinary goals of care discussion [Full code confirmed 12/25] Discussed with husband 12/26  1/27, PA - [facility] For pager details, please see AMION or use Epic chat  After 1900, please call [facility] for cross coverage needs 05/18/2021, 8:41 AM

## 2021-05-18 NOTE — Progress Notes (Signed)
Subjective: 4 Days Post-Op Procedure(s) (LRB): TOTAL HIP ARTHROPLASTY (Right) Patient reports pain as  ok .  Pt currently sitting up in chair assisted by PT 2 person assist to pivot to chair per report.  Pt very lethargic currently is awake and will sometimes respond to ques but drastically different from when I saw her Sat AM.  Eyes open but seems to stare off in the distance, not really tracking well or only part of the time.    Husband not currently at bedside.  Objective: Vital signs in last 24 hours: Temp:  [97.9 F (36.6 C)-99.6 F (37.6 C)] 97.9 F (36.6 C) (12/27 1200) Pulse Rate:  [92-104] 97 (12/27 0500) Resp:  [11-18] 12 (12/27 1400) BP: (102-152)/(44-108) 114/54 (12/27 1400) SpO2:  [92 %-100 %] 97 % (12/27 1400)  Intake/Output from previous day: 12/26 0701 - 12/27 0700 In: 1953.8 [I.V.:1953.8] Out: 800 [Urine:800] Intake/Output this shift: Total I/O In: 1290.3 [P.O.:150; I.V.:1044.6; IV Piggyback:95.7] Out: -   Recent Labs    05/16/21 0729 05/16/21 1931 05/17/21 0302 05/18/21 0253  HGB 8.1* 7.4* 7.1* 7.5*   Recent Labs    05/17/21 0302 05/18/21 0253  WBC 13.5* 17.4*  RBC 2.49* 2.69*  HCT 22.3* 24.4*  PLT 315 398   Recent Labs    05/17/21 0302 05/18/21 0253  NA 136 137  K 3.9 3.9  CL 106 106  CO2 23 22  BUN 18 11  CREATININE 0.83 0.71  GLUCOSE 103* 84  CALCIUM 7.1* 7.3*   No results for input(s): LABPT, INR in the last 72 hours.  Sensation intact distally Intact pulses distally Dorsiflexion/Plantar flexion intact Incision: dressing C/D/I   Assessment/Plan: 4 Days Post-Op Procedure(s) (LRB): TOTAL HIP ARTHROPLASTY (Right) Up with therapy WBAT RLE post hip precautions Ok to cont chemical dvt proph per ortho Dsg change prn Currently minimizing narcotic use, she did receive some this AM after screaming out in pain  Appreciate multi team approach to care of pt will cont to follow.      Margart Sickles 05/18/2021, 2:21 PM

## 2021-05-18 NOTE — Progress Notes (Addendum)
Physical Therapy Treatment Patient Details Name: Jeanette Bennett MRN: [id number] DOB: March 29, 1951 Today's Date: 05/18/2021   History of Present Illness Patient is 70 y.o. female s/p Rt THA posterior approach on 05/14/21 with pMH significant for anemia, OA, anxiety, depression, HTN, GERD, Rt RCR.    PT Comments    POD # 4 pt seen in Step down ICU Pt very groggy, slow, eyes shut most of session, following repeat commands with increased time. 75% lethargic/25% alert.  Unable to hold a cup of water and difficulty swallowing her water. Pt was given only 5 mg oxy 2.5 hours prior then 1 mg IV Morphine 30 min later "because she was screaming in pain" stated RN. Assisted OOB to recliner was very difficult due to level of arousal.  General bed mobility comments: Pt required + 2 Max/Toal Assist to transition to EOB.  Very groggy. Pt able to sit EOB x 4 min at Supervision level but with a collapsed posture and groggy state.  RA was 100%.  Pain level was only 3/10. General transfer comment: attempted sit to stand however pt too groggy to even grip the walker. Pt was unable to rise despite + 2 side bu side assist.  So Therapist had to "Bear Hug" stand pivot sit 1/4 turn to pt's left with assist to weight shift and complete turn.  Pt was able to support her own weight but unable to shift weight onto L enough to clear R.  Audible crepitus noted in R knee.  Positioned in recliner.  Rec Maxi Move back to bed.  Pad placed in recliner under pt.General Gait Details: POD # 4 and pt has yet to amb. Pt appears over sedated.  Pt has HIGH anxiety associated with pain.  Try IV Ativan for break thru pain vs more Narcotics.   Recommendations for follow up therapy are one component of a multi-disciplinary discharge planning process, led by the attending physician.  Recommendations may be updated based on patient status, additional functional criteria and insurance authorization.  Follow Up Recommendations  Follow  physician's recommendations for discharge plan and follow up therapies     Assistance Recommended at Discharge    Equipment Recommendations  Rolling walker (2 wheels);BSC/3in1    Recommendations for Other Services       Precautions / Restrictions Precautions Precautions: Fall Precaution Comments: posterior hip precautions Restrictions Weight Bearing Restrictions: No Other Position/Activity Restrictions: WBAT     Mobility  Bed Mobility Overal bed mobility: Needs Assistance Bed Mobility: Supine to Sit     Supine to sit: Total assist;+2 for physical assistance;+2 for safety/equipment;Max assist     General bed mobility comments: Pt required + 2 Max/Toal Assist to transition to EOB.  Very groggy. Pt able to sit EOB x 4 min at Supervision level but with a collapsed posture and groggy state.  RA was 100%.  Pain level was only 3/10.    Transfers Overall transfer level: Needs assistance Equipment used: Rolling walker (2 wheels);None Transfers: Sit to/from Stand;Bed to chair/wheelchair/BSC   Stand pivot transfers: Max assist;+2 safety/equipment         General transfer comment: attempted sit to stand however pt too groggy to even grip the walker. Pt was unable to rise despite + 2 side bu side assist.  So Therapist had to "Bear Hug" stand pivot sit 1/4 turn to pt's left with assist to weight shift and complete turn.  Pt was able to support her own weight but unable to shift weight onto L enough  to clear R.  Audible crepitus noted in R knee.  Positioned in recliner.  Rec Maxi Move back to bed.  Pad placed in recliner under pt.    Ambulation/Gait               General Gait Details: POD # 4 and pt has yet to amb   Stairs             Wheelchair Mobility    Modified Rankin (Stroke Patients Only)       Balance                                            Cognition Arousal/Alertness: Awake/alert;Lethargic (25/75) Behavior During Therapy: WFL  for tasks assessed/performed Overall Cognitive Status: Within Functional Limits for tasks assessed                                 General Comments: Pt very groggy, slow, eyes shut most of session, following repeat commands with increased time. 75% lethargic/25% alert.  Unable to hold a cup of water and difficulty swallowing her water. Pt was given only 5 mg oxy 2.5 hours prior then 1 mg IV Morphine 30 min later "because she was screaming in pain" stated RN.        Exercises      General Comments        Pertinent Vitals/Pain Pain Assessment: Faces Pain Score: 3  Faces Pain Scale: Hurts little more Pain Location: R hip Pain Descriptors / Indicators: Operative site guarding;Moaning;Discomfort;Grimacing Pain Intervention(s): Monitored during session;Premedicated before session;Repositioned    Home Living                          Prior Function            PT Goals (current goals can now be found in the care plan section) Progress towards PT goals: Progressing toward goals    Frequency    7X/week      PT Plan Current plan remains appropriate    Co-evaluation              AM-PAC PT "6 Clicks" Mobility   Outcome Measure  Help needed turning from your back to your side while in a flat bed without using bedrails?: Total Help needed moving from lying on your back to sitting on the side of a flat bed without using bedrails?: Total Help needed moving to and from a bed to a chair (including a wheelchair)?: Total Help needed standing up from a chair using your arms (e.g., wheelchair or bedside chair)?: Total Help needed to walk in hospital room?: Total Help needed climbing 3-5 steps with a railing? : Total 6 Click Score: 6    End of Session Equipment Utilized During Treatment: Gait belt Activity Tolerance: Patient limited by lethargy Patient left: in chair;with call bell/phone within reach;with nursing/sitter in room Nurse Communication:  Mobility status;Need for lift equipment (RN in room at time of session) PT Visit Diagnosis: Muscle weakness (generalized) (M62.81);Unsteadiness on feet (R26.81);Difficulty in walking, not elsewhere classified (R26.2);Pain Pain - Right/Left: Right Pain - part of body: Hip     Time: 1610-9604 PT Time Calculation (min) (ACUTE ONLY): 26 min  Charges:  $Therapeutic Activity: 23-37 mins  Rica Koyanagi  PTA Acute  Rehabilitation Services Pager      [phone] Office      [phone]

## 2021-05-18 NOTE — Progress Notes (Signed)
Was reported to evening RN that patient had been alert enough throughout the day to answer orientation questions and participate in physical therapy. Patient began crying out for pain medication from start of my shift, report was interrupted by my charge nurse to tell me the husband had called out saying she needed something. Upon entering room patient does appear to be in pain per the PAINAD rating scale, she is restless and kicking both legs. I assessed the patient before giving her the meds and I was unable to get full answers concerning her orientation status. I gave the morphine as prescribed to help with the immediate pain, and gave her the oxy (which she hadn't received since this morning 12/26  per [facility]) with her PM meds to hopefully control her pain throughout the night. Patient was able to take medication by mouth, and drink during this time but did require significant coaching. No choking or holding pills/water in her mouth before swallowing was observed. Perfusion to both hands on each arm seem to be impaired as well. I have been unsuccessful in getting a correct o2 reading from either hand even after multiple pulse ox changes, and rewarming using heat packs. O2 reading from hands would read in the 70's with dampened pleth and HR did not correlate with EKG. SpO2 reading appropriately from forehead probe. Neurologist from [facility] came and performed an assessment on the patient during this time. Appropriate AM labs ordered and awaiting results.

## 2021-05-18 NOTE — Progress Notes (Signed)
ABG collected and send down to the lab. Lab called.

## 2021-05-18 NOTE — Progress Notes (Addendum)
eLink Physician-Brief Progress Note Patient Name: Jeanette Bennett [facility] DOB: 27-Oct-1950 MRN: [id number]   Date of Service  05/18/2021  HPI/Events of Note  RN rerported lethargy. Patient got oxycodone and morphine tonight.   eICU Interventions  Check ABG     Intervention Category Major Interventions: Delirium, psychosis, severe agitation - evaluation and management  Abdulrahim Siddiqi G Hani Campusano 05/18/2021, 12:38 AM  Addendum -  ABG noted earlier, not concerning I was also told that neurology team had come by and seen the patient, their note is pending

## 2021-05-18 NOTE — Progress Notes (Addendum)
0730 Bedside shift report. Per night RN Stacy, pt is more alert now than the previous shift. Pt A&Ox3, able to follow simple commands. States her pain, is "OK". Pt bathed and cleaned up with full linen change. Rutherford Guys, PA-C at bedside to assess pt.   0800 Pt assisted with breakfast tray set up. Stated she wasn't hungry. Pt drank some juice.   0830 Pt sleeping, NAD, WCTM.   0930-1015 Pt moaning and crying out in pain. Pt confused, can't tell RN where the pain is, what kind of pain she is having, or able to express her needs. RN medicated per MAR. RN repositioned patient in bed, tried up, down, turning to side, but nothing seemed to work for pain. Pt still crying out in pain, stating, "Oh God please help me". Charge RN at bedside, decided to medicate with morphine.   1030 Pt checked on and stated pain is not as bad. Updated patient's husband at bedside.   1100 Pt's bed alarm going off, husband trying to reposition patient. RN and NT, turned patient to side. Pt sleeping, WCTM.   1215 - 1300 Pt sleeping comfortably. Pt easy to arouse. Medicated per MAR. Physical therapy here as well to work with pt. PT able to get the pt OOB and sit in the recliner. Lunch tray set up for patient. Pt refusing to eat, states, she's not hungry. RN encouraged pt to eat and attempted feeding patient mashed potatoes but pt refusing. Pt drinking water. Denies pain at this time. WCTM.   1330 Ortho here to see pt. Pt waxing and waning throughout the day. Sometimes pt will be A&Ox4, sometimes A&Ox name only. Pt will also sometimes follow directions and then sometimes look at person with a blank stare. Oxygen titrated to RA. Oxygen sats between 95-100% on RA. WCTM.

## 2021-05-18 NOTE — TOC Progression Note (Signed)
Transition of Care [facility]) - Progression Note    Patient Details  Name: Jeanette Bennett MRN: [id number] Date of Birth: Sep 04, 1950  Transition of Care [facility]) CM/SW Contact  Golda Acre, RN Phone Number: 05/18/2021, 8:36 AM  Clinical Narrative:    Rolling walker ordered through [facility]   Expected Discharge Plan: Home/Self Care Barriers to Discharge: Continued Medical Work up  Expected Discharge Plan and Services Expected Discharge Plan: Home/Self Care   Discharge Planning Services: CM Consult   Living arrangements for the past 2 months: Single Family Home Expected Discharge Date: 05/14/21               DME Arranged: Dan Humphreys rolling DME Agency: [employer] Date DME Agency Contacted: 05/18/21 Time DME Agency Contacted: [phone] Representative spoke with at DME Agency: Lelon Mast             Social Determinants of Health (SDOH) Interventions    Readmission Risk Interventions No flowsheet data found.

## 2021-05-19 DIAGNOSIS — M25551 Pain in right hip: Secondary | ICD-10-CM

## 2021-05-19 DIAGNOSIS — G8929 Other chronic pain: Secondary | ICD-10-CM | POA: Diagnosis not present

## 2021-05-19 DIAGNOSIS — M1611 Unilateral primary osteoarthritis, right hip: Secondary | ICD-10-CM | POA: Diagnosis not present

## 2021-05-19 LAB — HEPATIC FUNCTION PANEL
ALT: 272 U/L — ABNORMAL HIGH (ref 0–44)
AST: 218 U/L — ABNORMAL HIGH (ref 15–41)
Albumin: 2.4 g/dL — ABNORMAL LOW (ref 3.5–5.0)
Alkaline Phosphatase: 66 U/L (ref 38–126)
Bilirubin, Direct: 0.2 mg/dL (ref 0.0–0.2)
Indirect Bilirubin: 0.7 mg/dL (ref 0.3–0.9)
Total Bilirubin: 0.9 mg/dL (ref 0.3–1.2)
Total Protein: 6.2 g/dL — ABNORMAL LOW (ref 6.5–8.1)

## 2021-05-19 LAB — BASIC METABOLIC PANEL
Anion gap: 9 (ref 5–15)
BUN: 8 mg/dL (ref 8–23)
CO2: 24 mmol/L (ref 22–32)
Calcium: 7.5 mg/dL — ABNORMAL LOW (ref 8.9–10.3)
Chloride: 106 mmol/L (ref 98–111)
Creatinine, Ser: 0.61 mg/dL (ref 0.44–1.00)
GFR, Estimated: >60 mL/min (ref >60)
Glucose, Bld: 93 mg/dL (ref 70–99)
Potassium: 3.5 mmol/L (ref 3.5–5.1)
Sodium: 139 mmol/L (ref 135–145)

## 2021-05-19 LAB — CBC
HCT: 24.3 % — ABNORMAL LOW (ref 36.0–46.0)
Hemoglobin: 7.7 g/dL — ABNORMAL LOW (ref 12.0–15.0)
MCH: 28.6 pg (ref 26.0–34.0)
MCHC: 31.7 g/dL (ref 30.0–36.0)
MCV: 90.3 fL (ref 80.0–100.0)
Platelets: 452 10*3/uL — ABNORMAL HIGH (ref 150–400)
RBC: 2.69 MIL/uL — ABNORMAL LOW (ref 3.87–5.11)
RDW: 15.3 % (ref 11.5–15.5)
WBC: 12.4 10*3/uL — ABNORMAL HIGH (ref 4.0–10.5)
nRBC: 0.2 % (ref 0.0–0.2)

## 2021-05-19 LAB — AEROBIC/ANAEROBIC CULTURE W GRAM STAIN (SURGICAL/DEEP WOUND)
Culture: NO GROWTH
Culture: NO GROWTH
Gram Stain: NONE SEEN

## 2021-05-19 LAB — PHOSPHORUS: Phosphorus: 1.7 mg/dL — ABNORMAL LOW (ref 2.5–4.6)

## 2021-05-19 LAB — MAGNESIUM: Magnesium: 2.1 mg/dL (ref 1.7–2.4)

## 2021-05-19 MED ORDER — DULOXETINE HCL 30 MG PO CPEP
30.0000 mg | ORAL_CAPSULE | Freq: Every day | ORAL | Status: DC
  Administered 2021-05-19 – 2021-05-23 (×5): 30 mg via ORAL
  Filled 2021-05-19 (×5): qty 1

## 2021-05-19 MED ORDER — GABAPENTIN 100 MG PO CAPS
100.0000 mg | ORAL_CAPSULE | Freq: Two times a day (BID) | ORAL | Status: DC
  Administered 2021-05-19 – 2021-05-23 (×10): 100 mg via ORAL
  Filled 2021-05-19 (×10): qty 1

## 2021-05-19 MED ORDER — LORAZEPAM 2 MG/ML IJ SOLN
0.5000 mg | Freq: Once | INTRAMUSCULAR | Status: AC | PRN
  Administered 2021-05-20: 14:00:00 0.5 mg via INTRAVENOUS
  Filled 2021-05-19 (×2): qty 1

## 2021-05-19 MED ORDER — SODIUM PHOSPHATES 45 MMOLE/15ML IV SOLN
30.0000 mmol | Freq: Once | INTRAVENOUS | Status: AC
  Administered 2021-05-19: 11:00:00 30 mmol via INTRAVENOUS
  Filled 2021-05-19: qty 10

## 2021-05-19 MED ORDER — KETOROLAC TROMETHAMINE 15 MG/ML IJ SOLN
15.0000 mg | Freq: Three times a day (TID) | INTRAMUSCULAR | Status: DC | PRN
  Administered 2021-05-19 – 2021-05-22 (×6): 15 mg via INTRAVENOUS
  Filled 2021-05-19 (×6): qty 1

## 2021-05-19 MED ORDER — HYDROXYZINE HCL 10 MG PO TABS
10.0000 mg | ORAL_TABLET | Freq: Three times a day (TID) | ORAL | Status: DC | PRN
  Administered 2021-05-19 (×2): 10 mg via ORAL
  Filled 2021-05-19 (×4): qty 1

## 2021-05-19 NOTE — Progress Notes (Signed)
Occupational Therapy Treatment Patient Details Name: Jeanette Bennett MRN: [id number] DOB: 30-Jun-1950 Today's Date: 05/19/2021   History of present illness Patient is 70 y.o. female s/p Rt THA posterior approach on 05/14/21 with pMH significant for anemia, OA, anxiety, depression, HTN, GERD, Rt RCR.   OT comments  Treatment focused on ADL task and preparatory positioning and movement for functional mobility. Patient supine in bed and complains of pain 10/10. Patient encouraged to make movements in RLE to promote muscle activation, edema reduction and activity tolerance, Patient only tolerated minute movements. Patient positioned further upright position in bed and performed oral care. Improved cognition today but still severely limited by pain. Cont POC. Suspect patient will need short term rehab at discharge.   Recommendations for follow up therapy are one component of a multi-disciplinary discharge planning process, led by the attending physician.  Recommendations may be updated based on patient status, additional functional criteria and insurance authorization.    Follow Up Recommendations  Follow physician's recommendations for discharge plan and follow up therapies    Assistance Recommended at Discharge    Equipment Recommendations  Other (comment) (TBD)    Recommendations for Other Services      Precautions / Restrictions Precautions Precautions: Fall Precaution Comments: posterior hip precautions Restrictions Weight Bearing Restrictions: No Other Position/Activity Restrictions: WBAT       Mobility Bed Mobility                    Transfers                         Balance                                           ADL either performed or assessed with clinical judgement   ADL Overall ADL's : Needs assistance/impaired     Grooming: Oral care;Wash/dry face;Bed level;Minimal assistance Grooming Details (indicate cue type and  reason): min assist for oral care task today at bed level - requiring external assistance to maintain position of basin to keep from spillilng. verbal cue to rinse.                                    Extremity/Trunk Assessment              Vision Patient Visual Report: No change from baseline     Perception     Praxis      Cognition Arousal/Alertness: Awake/alert Behavior During Therapy: WFL for tasks assessed/performed Overall Cognitive Status: Within Functional Limits for tasks assessed                                            Exercises     Shoulder Instructions       General Comments      Pertinent Vitals/ Pain       Pain Assessment: 0-10 Pain Score: 10-Worst pain ever Pain Location: R hip Pain Descriptors / Indicators: Operative site guarding;Moaning;Discomfort;Grimacing;Restless Pain Intervention(s): Monitored during session;Premedicated before session  Home Living  Prior Functioning/Environment              Frequency  Min 2X/week        Progress Toward Goals  OT Goals(current goals can now be found in the care plan section)  Progress towards OT goals: Progressing toward goals  Acute Rehab OT Goals Patient Stated Goal: have less pain OT Goal Formulation: With patient Time For Goal Achievement: 05/30/21 Potential to Achieve Goals: Fair  Plan Discharge plan remains appropriate    Co-evaluation          OT goals addressed during session: ADL's and self-care      AM-PAC OT "6 Clicks" Daily Activity     Outcome Measure   Help from another person eating meals?: A Little Help from another person taking care of personal grooming?: A Little Help from another person toileting, which includes using toliet, bedpan, or urinal?: A Lot Help from another person bathing (including washing, rinsing, drying)?: A Lot Help from another person to put on and  taking off regular upper body clothing?: A Lot Help from another person to put on and taking off regular lower body clothing?: A Lot 6 Click Score: 14    End of Session    OT Visit Diagnosis: Unsteadiness on feet (R26.81);Muscle weakness (generalized) (M62.81);Other symptoms and signs involving cognitive function   Activity Tolerance Patient limited by pain   Patient Left in bed;with call bell/phone within reach;with nursing/sitter in room;with bed alarm set   Nurse Communication Mobility status        Time: 1308-6578 OT Time Calculation (min): 18 min  Charges: OT General Charges $OT Visit: 1 Visit OT Treatments $Self Care/Home Management : 8-22 mins  Waldron Session, OTR/L Acute Care Rehab Services  Office [phone] Pager: [phone]   Kelli Churn 05/19/2021, 12:05 PM

## 2021-05-19 NOTE — Progress Notes (Signed)
Subjective: 5 Days Post-Op Procedure(s) (LRB): TOTAL HIP ARTHROPLASTY (Right) Patient reports pain as mild.  Doing better today, alert, sitting in chair, greeted be and could have a discussion, answers questions appropriately.    Objective: Vital signs in last 24 hours: Temp:  [97.7 F (36.5 C)-99.7 F (37.6 C)] 99.3 F (37.4 C) (12/28 1214) Pulse Rate:  [92-110] 102 (12/28 1214) Resp:  [11-23] 18 (12/28 1214) BP: (110-161)/(27-120) 140/81 (12/28 1214) SpO2:  [88 %-100 %] 100 % (12/28 1214)  Intake/Output from previous day: 12/27 0701 - 12/28 0700 In: 3128.9 [P.O.:295; I.V.:2495.4; IV Piggyback:338.5] Out: 1100 [Urine:1100] Intake/Output this shift: Total I/O In: 660.2 [I.V.:346; IV Piggyback:314.2] Out: -   Recent Labs    05/16/21 1931 05/17/21 0302 05/18/21 0253 05/19/21 0617  HGB 7.4* 7.1* 7.5* 7.7*   Recent Labs    05/18/21 0253 05/19/21 0617  WBC 17.4* 12.4*  RBC 2.69* 2.69*  HCT 24.4* 24.3*  PLT 398 452*   Recent Labs    05/18/21 0253 05/19/21 0617  NA 137 139  K 3.9 3.5  CL 106 106  CO2 22 24  BUN 11 8  CREATININE 0.71 0.61  GLUCOSE 84 93  CALCIUM 7.3* 7.5*   Recent Labs    05/18/21 1455  INR 1.1    Sensation intact distally Intact pulses distally Dorsiflexion/Plantar flexion intact Incision: dressing C/D/I   Assessment/Plan: 5 Days Post-Op Procedure(s) (LRB): TOTAL HIP ARTHROPLASTY (Right) Up with therapy WBAT RLE, post hip precautions Dsg change prn Cont PT/OT- will likely need SNF with d/c Currently on heparin, normally d/c on bid 81mg  ASA unless other recommendations in her case. Pain med recs as well. Will continue to follow closely appreciate medicines assistance    05/19/2021, 4:08 PM

## 2021-05-19 NOTE — Progress Notes (Addendum)
Physical Therapy Treatment Patient Details Name: Jeanette Bennett MRN: [id number] DOB: February 26, 1951 Today's Date: 05/19/2021   History of Present Illness Patient is 70 y.o. female s/p Rt THA posterior approach on 05/14/21 with pMH significant for anemia, OA, anxiety, depression, HTN, GERD, Rt RCR.    PT Comments    POD # 5 pt seen in room 1336 Called to room by NT, pt asking about getting up.  Pt assisted to EOB by NT.  General Comments: pt more alert/awake but not yet back to 100%.  Feels "swimmy" and slightly delayed in verbal responces.  Feels "tired" but also stated "I can't wait to get out of this bed".Assisted with amb was difficult required + 2 assist.  General transfer comment: pt required + 2 Max Assist to rise from elevated bed plus use of B platform  EVA walker with heavy lean/support.  Great difficulty advancing ang tolerating WBing thru R LE due to pain.General Gait Details: pt was able to amb but with great difficulty limited distance 8 feet using B platform EVA walker with HEAVY lean on walker.  After 8 feet pt fatigued and c/o "swimmy" headed.  BP 133/54 HR 87.  Recliner following for safety.  Returned to room in recliner.  Attempted to perform a few TE's which required AAROM.  Positioned in recliner to comfort and applied ICE.    Recommendations for follow up therapy are one component of a multi-disciplinary discharge planning process, led by the attending physician.  Recommendations may be updated based on patient status, additional functional criteria and insurance authorization.  Follow Up Recommendations  Follow physician's recommendations for discharge plan and follow up therapies     Assistance Recommended at Discharge    Equipment Recommendations  Rolling walker (2 wheels);BSC/3in1 (Bariatric)    Recommendations for Other Services       Precautions / Restrictions Precautions Precautions: Fall Precaution Comments: posterior hip  precautions Restrictions Weight Bearing Restrictions: No Other Position/Activity Restrictions: WBAT     Mobility  Bed Mobility Overal bed mobility: Needs Assistance       Supine to sit: Max assist     General bed mobility comments: NT assisted pt to EOB    Transfers Overall transfer level: Needs assistance Equipment used: Rolling walker (2 wheels);None Transfers: Sit to/from Stand;Bed to chair/wheelchair/BSC Sit to Stand: Mod assist;From elevated surface;+2 safety/equipment Stand pivot transfers: Max assist;+2 safety/equipment         General transfer comment: pt required + 2 Max Assist to rise from elevated bed plus use of B platform  EVA walker with heavy lean/support.  Great difficulty advancing ang tolerating WBing thru R LE due to pain.    Ambulation/Gait Ambulation/Gait assistance: Max assist;+2 physical assistance;+2 safety/equipment Gait Distance (Feet): 8 Feet Assistive device: Bilateral platform walker Gait Pattern/deviations: Step-to pattern;Decreased stance time - right Gait velocity: decreased     General Gait Details: pt was able to amb but with great difficulty limited distance 8 feet using B platform EVA walker with HEAVY lean on walker.  After 8 feet pt fatigued and c/o "swimmy" headed.  BP 133/54 HR 87.  Recliner following for safety.   Stairs             Wheelchair Mobility    Modified Rankin (Stroke Patients Only)       Balance  Cognition Arousal/Alertness: Awake/alert Behavior During Therapy: WFL for tasks assessed/performed Overall Cognitive Status: Impaired/Different from baseline                                 General Comments: pt more alert/awake but not tey back to 100%.  Feels "swimmy" and slightly delayed in verbal responces.  Feels "tired" but also stated "I can't wait to get out of this bed".        Exercises      General Comments         Pertinent Vitals/Pain Pain Assessment: 0-10 Pain Score: 8  Pain Location: R hip Pain Descriptors / Indicators: Tender Pain Intervention(s): Monitored during session;Premedicated before session;Repositioned;Ice applied    Home Living                          Prior Function            PT Goals (current goals can now be found in the care plan section) Progress towards PT goals: Progressing toward goals    Frequency    7X/week      PT Plan Current plan remains appropriate    Co-evaluation       OT goals addressed during session: ADL's and self-care      AM-PAC PT "6 Clicks" Mobility   Outcome Measure  Help needed turning from your back to your side while in a flat bed without using bedrails?: A Lot Help needed moving from lying on your back to sitting on the side of a flat bed without using bedrails?: A Lot Help needed moving to and from a bed to a chair (including a wheelchair)?: A Lot Help needed standing up from a chair using your arms (e.g., wheelchair or bedside chair)?: A Lot Help needed to walk in hospital room?: A Lot Help needed climbing 3-5 steps with a railing? : Total 6 Click Score: 11    End of Session Equipment Utilized During Treatment: Gait belt Activity Tolerance: Patient limited by fatigue Patient left: in chair;with call bell/phone within reach;with nursing/sitter in room Nurse Communication: Mobility status;Need for lift equipment PT Visit Diagnosis: Muscle weakness (generalized) (M62.81);Unsteadiness on feet (R26.81);Difficulty in walking, not elsewhere classified (R26.2);Pain Pain - Right/Left: Right Pain - part of body: Hip     Time: 1353-1417 PT Time Calculation (min) (ACUTE ONLY): 24 min  Charges:  $Gait Training: 8-22 mins $Therapeutic Activity: 8-22 mins                     {Maleke Feria  PTA Acute  Rehabilitation Services Pager      [phone] Office      [phone]

## 2021-05-19 NOTE — Progress Notes (Signed)
PROGRESS NOTE    Jeanette Bennett [facility]  EZM:[id number] DOB: 1950/09/27 DOA: 05/14/2021 PCP: Lorelei Pont, DO    No chief complaint on file.   Brief Narrative:  underwent a posterior approach right total hip surgery on 05/14/2021.  Some hypotension over the last 24 hours, BP meds were held this morning 12/25.  She has had some progressive lethargy through the day 12/25, did work with PT around 1300.  Husband at bedside noted her to be sleeping heavily around 1400.  She reportedly received Dilaudid around 7:40AM, oxycodone 10 mg at 418 and 1020.  She does not have a known history of OSA.  She was found unresponsive around 6 PM would not wake to voice or sternal rub.  Oxygen saturations in 50's% on RA.  Question some short-lived seizure activity, apparently bit her tongue.  Oxygen placed and Narcan given and she moved to the ICU urgently.    Assessment & Plan:   Principal Problem:   Degenerative joint disease of right hip Active Problems:   Acute encephalopathy   Acute hypoxic respiratory failure probably secondary to narcotic use Nasal cannula oxygen to keep sats greater than 90%. Currently she is being weaned off oxygen at this time with sats greater than 92%.    Acute metabolic encephalopathy probably secondary to medication/narcotics. Patient is alert and oriented and responding to questions.   Witnessed activity suspicious for seizures Patient was started on Keppra Neurology consulted, EEG done on 05/17/2021 Ammonia level and B12 levels within normal limits.   Acute transaminitis probably secondary to hypoperfusion Improving, right upper quadrant ultrasound is negative.   History of anxiety and depression Continue with home medications.    Hypertension Continue with home meds.  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 Plan to DC on 81 mg of aspirin twice daily.  DVT prophylaxis: (Heparin) Code Status:  Full code Family Communication: Family at bedside disposition:   Status is: Inpatient  Remains inpatient appropriate because: pain control       Consultants:  Orthopedics.  PCCM.   Procedures: Total hip arthroplasty on the right Antimicrobials: None  Subjective: Alert and responding to questions and following commands  Objective: Vitals:   05/19/21 1007 05/19/21 1023 05/19/21 1214 05/19/21 1729  BP: (!) 153/120 119/72 140/81 127/79  Pulse: (!) 110 (!) 103 (!) 102 100  Resp: 11 17 18 18   Temp:   99.3 F (37.4 C)   TempSrc:   Oral   SpO2: 96% 95% 100% 95%  Weight:      Height:        Intake/Output Summary (Last 24 hours) at 05/19/2021 1848 Last data filed at 05/19/2021 1500 Gross per 24 hour  Intake 1958.09 ml  Output 600 ml  Net 1358.09 ml   Filed Weights   05/14/21 0808 05/14/21 1733  Weight: 103.4 kg 103.4 kg    Examination:  General exam: Appears calm and comfortable  Respiratory system: Clear to auscultation. Respiratory effort normal. Cardiovascular system: S1 & S2 heard, RRR. No JVD, murmurs, rubs, gallops or clicks. No pedal edema. Gastrointestinal system: Abdomen is nondistended, soft and nontender. No organomegaly or masses felt. Normal bowel sounds heard. Central nervous system: Alert and oriented. No focal neurological deficits. Extremities: Symmetric 5 x 5 power. Skin: No rashes, lesions or ulcers Psychiatry: Judgement and insight appear normal. Mood & affect appropriate.     Data Reviewed: I have personally reviewed following labs and imaging studies  CBC: Recent Labs  Lab 05/15/21 0327 05/16/21 0729 05/16/21 1931 05/17/21 0302 05/18/21 0253 05/19/21 0617  WBC 12.3* 14.0* 14.3* 13.5* 17.4* 12.4*  NEUTROABS 9.5* 10.9*  --   --   --   --   HGB 8.2* 8.1* 7.4* 7.1* 7.5* 7.7*  HCT 26.1* 25.3* 23.3* 22.3* 24.4* 24.3*  MCV 89.1 90.4 90.3 89.6 90.7 90.3  PLT 336 347 308 315 398 452*    Basic Metabolic Panel: Recent Labs  Lab  05/15/21 0327 05/16/21 1931 05/17/21 0302 05/18/21 0253 05/19/21 0617  NA 136 132* 136 137 139  K 3.8 4.3 3.9 3.9 3.5  CL 101 106 106 106 106  CO2 27 22 23 22 24   GLUCOSE 124* 139* 103* 84 93  BUN 16 21 18 11 8   CREATININE 1.04* 0.97 0.83 0.71 0.61  CALCIUM 7.8* 7.0* 7.1* 7.3* 7.5*  MG  --   --   --  1.9 2.1  PHOS  --   --   --   --  1.7*    GFR: Estimated Creatinine Clearance: 84.1 mL/min (by C-G formula based on SCr of 0.61 mg/dL).  Liver Function Tests: Recent Labs  Lab 05/17/21 0302 05/18/21 0253 05/19/21 0617  AST 593* 294* 218*  ALT 306* 293* 272*  ALKPHOS 51 69 66  BILITOT 0.8 0.8 0.9  PROT 5.4* 6.4* 6.2*  ALBUMIN 2.3* 2.6* 2.4*    CBG: Recent Labs  Lab 05/16/21 1742 05/16/21 1753 05/17/21 0718  GLUCAP 140* 123* 87     Recent Results (from the past 240 hour(s))  Aerobic/Anaerobic Culture w Gram Stain (surgical/deep wound)     Status: None   Collection Time: 05/14/21 11:00 AM   Specimen: PATH Soft tissue resection  Result Value Ref Range Status   Specimen Description   Final    HIP synovium right hip Performed at [facility], [address] [place], [place]    Special Requests   Final    NONE Performed at [facility], [address], [place], [place]    Gram Stain   Final    RARE WBC PRESENT, PREDOMINANTLY MONONUCLEAR NO ORGANISMS SEEN    Culture   Final    No growth aerobically or anaerobically. Performed at [facility], [address] [place], [address] [place]    Report Status 05/19/2021 FINAL  Final  Aerobic/Anaerobic Culture w Gram Stain (surgical/deep wound)     Status: None   Collection Time: 05/14/21 11:10 AM   Specimen: PATH Bone resection; Tissue  Result Value Ref Range Status   Specimen Description   Final    BONE RIGHT FEMORAL HEAD Performed at [facility], [address], [place], [place]    Special Requests   Final     NONE Performed at [facility], [address], [place], [place]    Gram Stain NO WBC SEEN NO ORGANISMS SEEN   Final   Culture   Final    No growth aerobically or anaerobically. Performed at [facility], [address], [place], [address] [place]    Report Status 05/19/2021 FINAL  Final  Culture, blood (routine x 2)     Status: None (Preliminary result)   Collection Time: 05/16/21  7:31 PM   Specimen: BLOOD RIGHT HAND  Result Value Ref Range Status   Specimen Description   Final    BLOOD RIGHT HAND Performed at [facility], [address] [place], [place] [address]    Special Requests   Final    BOTTLES DRAWN AEROBIC ONLY Blood Culture results may not be optimal due to an inadequate volume of blood received in culture bottles Performed at [facility], [address] [place], [place] [address]    Culture   Final    NO GROWTH 3 DAYS Performed at [facility], [address], [place], [place] [address]    Report Status PENDING  Incomplete  Culture, blood (routine x 2)     Status: None (Preliminary result)   Collection Time: 05/16/21  7:31 PM   Specimen: BLOOD  Result Value Ref Range Status   Specimen Description   Final    BLOOD RIGHT WRIST Performed at [facility], [address] [place], [place] [address]    Special Requests   Final    BOTTLES DRAWN AEROBIC ONLY Blood Culture results may not be optimal due to an inadequate volume of blood received in culture bottles Performed at [facility], [address], [place], [place] [address]    Culture   Final    NO GROWTH 3 DAYS Performed at [facility], [address] [place], [place] [address]    Report Status PENDING  Incomplete  MRSA Next Gen by PCR, Nasal     Status: None   Collection Time: 05/16/21  7:41 PM   Specimen: Nasal Mucosa; Nasal Swab  Result Value Ref Range Status   MRSA by PCR  Next Gen NOT DETECTED NOT DETECTED Final    Comment: (NOTE) The GeneXpert MRSA Assay (FDA approved for NASAL specimens only), is one component of a comprehensive MRSA colonization surveillance program. It is not intended to diagnose MRSA infection nor to guide or monitor treatment for MRSA infections. Test performance is not FDA approved in patients less than 57 years old. Performed at [facility], [address], [place], [place] [address]   Resp Panel by RT-PCR (Flu A&B, Covid) Nasopharyngeal Swab     Status: None   Collection Time: 05/17/21  6:39 PM   Specimen: Nasopharyngeal Swab; Nasopharyngeal(NP) swabs in vial transport medium  Result Value Ref Range Status   SARS Coronavirus 2 by RT PCR NEGATIVE NEGATIVE Final    Comment: (NOTE) SARS-CoV-2 target nucleic acids are NOT DETECTED.  The SARS-CoV-2 RNA is generally detectable in upper respiratory specimens during the acute phase of infection. The lowest concentration of SARS-CoV-2 viral copies this assay can detect is 138 copies/mL. A negative result does not preclude SARS-Cov-2 infection and should not be used as the sole basis for treatment or other patient management decisions. A negative result may occur with  improper specimen collection/handling, submission of specimen other than nasopharyngeal swab, presence of viral mutation(s) within the areas targeted by this assay, and inadequate number of viral copies(<138 copies/mL). A negative result must be combined with clinical observations, patient history, and epidemiological information. The expected result is Negative.  Fact Sheet for Patients:  BloggerCourse.com  Fact Sheet for Healthcare Providers:  SeriousBroker.it  This test is no t yet approved or cleared by the [place] FDA and  has been authorized for detection and/or diagnosis of SARS-CoV-2 by FDA under an Emergency Use Authorization (EUA).  This EUA will remain  in effect (meaning this test can be used) for the duration of the COVID-19 declaration under Section 564(b)(1) of the Act, 21 U.S.C.section 360bbb-3(b)(1), unless the authorization is terminated  or  revoked sooner.       Influenza A by PCR NEGATIVE NEGATIVE Final   Influenza B by PCR NEGATIVE NEGATIVE Final    Comment: (NOTE) The Xpert Xpress SARS-CoV-2/FLU/RSV plus assay is intended as an aid in the diagnosis of influenza from Nasopharyngeal swab specimens and should not be used as a sole basis for treatment. Nasal washings and aspirates are unacceptable for Xpert Xpress SARS-CoV-2/FLU/RSV testing.  Fact Sheet for Patients: BloggerCourse.com  Fact Sheet for Healthcare Providers: SeriousBroker.it  This test is not yet approved or cleared by the [place] FDA and has been authorized for detection and/or diagnosis of SARS-CoV-2 by FDA under an Emergency Use Authorization (EUA). This EUA will remain in effect (meaning this test can be used) for the duration of the COVID-19 declaration under Section 564(b)(1) of the Act, 21 U.S.C. section 360bbb-3(b)(1), unless the authorization is terminated or revoked.  Performed at [facility], [address] [place], [place] [address]          Radiology Studies: No results found.      Scheduled Meds:  aspirin  81 mg Oral BID   chlorhexidine  15 mL Mouth Rinse BID   Chlorhexidine Gluconate Cloth  6 each Topical Daily   docusate sodium  100 mg Oral BID   DULoxetine  30 mg Oral Daily   gabapentin  100 mg Oral BID   heparin injection (subcutaneous)  5,000 Units Subcutaneous Q8H   mouth rinse  15 mL Mouth Rinse q12n4p   pantoprazole  80 mg Oral Q1200   potassium chloride  10 mEq Oral Daily   [START ON 05/21/2021] thiamine  100 mg Oral Daily   Continuous Infusions:  sodium chloride 100 mL/hr at 05/19/21 0931   levETIRAcetam Stopped  (05/19/21 0835)   sodium chloride     thiamine injection 500 mg (05/19/21 1650)     LOS: 2 days        Kathlen Mody, MD [facility]   To contact the attending provider between 7A-7P or the covering provider during after hours 7P-7A, please log into the web site [email or web address] and access using universal [place] password for that web site. If you do not have the password, please call the hospital operator.  05/19/2021, 6:48 PM

## 2021-05-19 NOTE — Progress Notes (Signed)
eLink Physician-Brief Progress Note Patient Name: Deola Rewis [facility] DOB: [birth year] MRN: [id number]   Date of Service  05/19/2021  HPI/Events of Note  Patient still has pain. Fentanyl 50 mcg IV seems to last 1 to 1.5 hours. Will restart home Gabapentin.   eICU Interventions  Plan: Gabapentin 100 mg PO now and Q 12 hours.      Intervention Category Major Interventions: Other:  Mattie Novosel Dennard Nip 05/19/2021, 2:27 AM

## 2021-05-19 NOTE — Progress Notes (Signed)
Patient refused MRI of brain and requested it be done next day due to hip pain and discomfort. Patient was provided pain medication and educated on the importance of not delaying the MRI. Patient continued to refuse. NP notified of patient's decision and MRI tech notified patient refused until tomorrow. Will continue to assess.

## 2021-05-20 ENCOUNTER — Inpatient Hospital Stay (HOSPITAL_COMMUNITY): Payer: Medicare PPO

## 2021-05-20 DIAGNOSIS — G8929 Other chronic pain: Secondary | ICD-10-CM | POA: Diagnosis not present

## 2021-05-20 DIAGNOSIS — M1611 Unilateral primary osteoarthritis, right hip: Secondary | ICD-10-CM | POA: Diagnosis not present

## 2021-05-20 DIAGNOSIS — M25551 Pain in right hip: Secondary | ICD-10-CM | POA: Diagnosis not present

## 2021-05-20 MED ORDER — OXYCODONE-ACETAMINOPHEN 5-325 MG PO TABS
1.0000 | ORAL_TABLET | Freq: Three times a day (TID) | ORAL | Status: DC | PRN
  Administered 2021-05-20 – 2021-05-22 (×5): 1 via ORAL
  Filled 2021-05-20 (×5): qty 1

## 2021-05-20 MED ORDER — GADOBUTROL 1 MMOL/ML IV SOLN: 10.0000 mL | Freq: Once | INTRAVENOUS | Status: DC | PRN

## 2021-05-20 MED ORDER — FENTANYL CITRATE PF 50 MCG/ML IJ SOSY: 12.5000 ug | PREFILLED_SYRINGE | INTRAMUSCULAR | Status: DC | PRN

## 2021-05-20 MED ORDER — HYDROXYZINE HCL 25 MG PO TABS: 25.0000 mg | ORAL_TABLET | Freq: Three times a day (TID) | ORAL | Status: DC | PRN

## 2021-05-20 NOTE — Progress Notes (Signed)
Unable to give contrast due to pt confusion. the patient was unable to hold still. Complete MRI brain without contrast was scanned and turned in.

## 2021-05-20 NOTE — Progress Notes (Signed)
PROGRESS NOTE    Scheryl [facility]born [facility]  TZG:[id number] DOB: 1951/02/28 DOA: 05/14/2021 PCP: Lorelei Pont, DO    No chief complaint on file.   Brief Narrative:  underwent a posterior approach right total hip surgery on 05/14/2021.  Some hypotension over the last 24 hours, BP meds were held this morning 12/25.  She has had some progressive lethargy through the day 12/25, did work with PT around 1300.  Husband at bedside noted her to be sleeping heavily around 1400.  She reportedly received Dilaudid around 7:40AM, oxycodone 10 mg at 418 and 1020.  She does not have a known history of OSA.  She was found unresponsive around 6 PM would not wake to voice or sternal rub.  Oxygen saturations in 50's% on RA.  Question some short-lived seizure activity, apparently bit her tongue.  Oxygen placed and Narcan given and she moved to the ICU urgently. Pt seen and examined today. She was transferred yesterday as her encephalopathy improved. She worked with PT today.     Assessment & Plan:   Principal Problem:   Degenerative joint disease of right hip Active Problems:   Acute encephalopathy   Acute hypoxic respiratory failure probably secondary to narcotic use Nasal cannula oxygen to keep sats greater than 90%. Weaned off oxygen and is on RA.     Acute metabolic encephalopathy probably secondary to medication/narcotics. Patient is alert and oriented and responding to questions, WORK ING WITH pt.    Witnessed activity suspicious for seizures Patient was started on Keppra Neurology consulted, EEG done on 05/17/2021 Ammonia level and B12 levels within normal limits.   Acute transaminitis probably secondary to hypoperfusion Improving, right upper quadrant ultrasound is negative.   History of anxiety and depression Continue with home medications.    Hypertension Continue with home meds.  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 Plan to DC on 81 mg of aspirin twice daily.  DVT prophylaxis: (Heparin) Code Status: Full code Family Communication: Family at bedside  disposition:   Status is: Inpatient  Remains inpatient appropriate because: pain control and SNF placement.        Consultants:  Orthopedics.  PCCM.   Procedures: Total hip arthroplasty on the right Antimicrobials: None  Subjective: Pain better controlled, but nauseated   Objective: Vitals:   05/20/21 0143 05/20/21 0529 05/20/21 0944 05/20/21 1145  BP: 125/72 125/72 129/68 120/71  Pulse: 96 (!) 102 (!) 102 98  Resp: 18 16 (!) 22   Temp: 99 F (37.2 C) 99.6 F (37.6 C) 98.2 F (36.8 C) 98.6 F (37 C)  TempSrc: Oral Oral Oral Oral  SpO2: 98% 100% 95% 94%  Weight:      Height:        Intake/Output Summary (Last 24 hours) at 05/20/2021 1455 Last data filed at 05/20/2021 1321 Gross per 24 hour  Intake 1193.57 ml  Output 800 ml  Net 393.57 ml    Filed Weights   05/14/21 0808 05/14/21 1733  Weight: 103.4 kg 103.4 kg    Examination:  General exam: Appears calm and comfortable  Respiratory system: Clear to auscultation. Respiratory effort normal. Cardiovascular system: S1 & S2 heard, RRR. No JVD,  No pedal edema. Gastrointestinal system: Abdomen is nondistended, soft and nontender. Normal bowel sounds heard. Central nervous system: Alert and oriented. No focal neurological deficits. Extremities: painful ROM of the right lower extremity.  Skin: No rashes, lesions or ulcers Psychiatry: anxious.  Data Reviewed: I have personally reviewed following labs and imaging studies  CBC: Recent Labs  Lab 05/15/21 0327 05/16/21 0729 05/16/21 1931 05/17/21 0302 05/18/21 0253 05/19/21 0617  WBC 12.3* 14.0* 14.3* 13.5* 17.4* 12.4*  NEUTROABS 9.5* 10.9*  --   --   --   --   HGB 8.2* 8.1* 7.4* 7.1* 7.5* 7.7*  HCT 26.1* 25.3* 23.3* 22.3* 24.4* 24.3*  MCV 89.1 90.4 90.3 89.6 90.7 90.3  PLT 336 347 308 315 398  452*     Basic Metabolic Panel: Recent Labs  Lab 05/15/21 0327 05/16/21 1931 05/17/21 0302 05/18/21 0253 05/19/21 0617  NA 136 132* 136 137 139  K 3.8 4.3 3.9 3.9 3.5  CL 101 106 106 106 106  CO2 27 22 23 22 24   GLUCOSE 124* 139* 103* 84 93  BUN 16 21 18 11 8   CREATININE 1.04* 0.97 0.83 0.71 0.61  CALCIUM 7.8* 7.0* 7.1* 7.3* 7.5*  MG  --   --   --  1.9 2.1  PHOS  --   --   --   --  1.7*     GFR: Estimated Creatinine Clearance: 84.1 mL/min (by C-G formula based on SCr of 0.61 mg/dL).  Liver Function Tests: Recent Labs  Lab 05/17/21 0302 05/18/21 0253 05/19/21 0617  AST 593* 294* 218*  ALT 306* 293* 272*  ALKPHOS 51 69 66  BILITOT 0.8 0.8 0.9  PROT 5.4* 6.4* 6.2*  ALBUMIN 2.3* 2.6* 2.4*     CBG: Recent Labs  Lab 05/16/21 1742 05/16/21 1753 05/17/21 0718  GLUCAP 140* 123* 87      Recent Results (from the past 240 hour(s))  Aerobic/Anaerobic Culture w Gram Stain (surgical/deep wound)     Status: None   Collection Time: 05/14/21 11:00 AM   Specimen: PATH Soft tissue resection  Result Value Ref Range Status   Specimen Description   Final    HIP synovium right hip Performed at [facility], [address] [place], [place]    Special Requests   Final    NONE Performed at [facility], [address] [place], [place]    Gram Stain   Final    RARE WBC PRESENT, PREDOMINANTLY MONONUCLEAR NO ORGANISMS SEEN    Culture   Final    No growth aerobically or anaerobically. Performed at [facility], [address], [place], [address] [place]    Report Status 05/19/2021 FINAL  Final  Aerobic/Anaerobic Culture w Gram Stain (surgical/deep wound)     Status: None   Collection Time: 05/14/21 11:10 AM   Specimen: PATH Bone resection; Tissue  Result Value Ref Range Status   Specimen Description   Final    BONE RIGHT FEMORAL HEAD Performed at [facility], [address]  [place], [place]    Special Requests   Final    NONE Performed at [facility], [address], [place], [place]    Gram Stain NO WBC SEEN NO ORGANISMS SEEN   Final   Culture   Final    No growth aerobically or anaerobically. Performed at [facility], [address], [place], [address] [place]    Report Status 05/19/2021 FINAL  Final  Culture, blood (routine x 2)     Status: None (Preliminary result)   Collection Time: 05/16/21  7:31 PM   Specimen: BLOOD RIGHT HAND  Result Value Ref Range Status  Specimen Description   Final    BLOOD RIGHT HAND Performed at [facility], [address] [place], [place] [address]    Special Requests   Final    BOTTLES DRAWN AEROBIC ONLY Blood Culture results may not be optimal due to an inadequate volume of blood received in culture bottles Performed at [facility], [address] [place], [place] [address]    Culture   Final    NO GROWTH 4 DAYS Performed at [facility], [address] [place], [place] [address]    Report Status PENDING  Incomplete  Culture, blood (routine x 2)     Status: None (Preliminary result)   Collection Time: 05/16/21  7:31 PM   Specimen: BLOOD  Result Value Ref Range Status   Specimen Description   Final    BLOOD RIGHT WRIST Performed at [facility], [address] [place], [place] [address]    Special Requests   Final    BOTTLES DRAWN AEROBIC ONLY Blood Culture results may not be optimal due to an inadequate volume of blood received in culture bottles Performed at [facility], [address], [place], [place] [address]    Culture   Final    NO GROWTH 4 DAYS Performed at [facility], [address], [place], [place] [address]    Report Status PENDING  Incomplete  MRSA Next Gen by PCR, Nasal     Status: None   Collection Time: 05/16/21  7:41 PM   Specimen: Nasal Mucosa;  Nasal Swab  Result Value Ref Range Status   MRSA by PCR Next Gen NOT DETECTED NOT DETECTED Final    Comment: (NOTE) The GeneXpert MRSA Assay (FDA approved for NASAL specimens only), is one component of a comprehensive MRSA colonization surveillance program. It is not intended to diagnose MRSA infection nor to guide or monitor treatment for MRSA infections. Test performance is not FDA approved in patients less than 76 years old. Performed at [facility], [address] [place], [place] [address]   Resp Panel by RT-PCR (Flu A&B, Covid) Nasopharyngeal Swab     Status: None   Collection Time: 05/17/21  6:39 PM   Specimen: Nasopharyngeal Swab; Nasopharyngeal(NP) swabs in vial transport medium  Result Value Ref Range Status   SARS Coronavirus 2 by RT PCR NEGATIVE NEGATIVE Final    Comment: (NOTE) SARS-CoV-2 target nucleic acids are NOT DETECTED.  The SARS-CoV-2 RNA is generally detectable in upper respiratory specimens during the acute phase of infection. The lowest concentration of SARS-CoV-2 viral copies this assay can detect is 138 copies/mL. A negative result does not preclude SARS-Cov-2 infection and should not be used as the sole basis for treatment or other patient management decisions. A negative result may occur with  improper specimen collection/handling, submission of specimen other than nasopharyngeal swab, presence of viral mutation(s) within the areas targeted by this assay, and inadequate number of viral copies(<138 copies/mL). A negative result must be combined with clinical observations, patient history, and epidemiological information. The expected result is Negative.  Fact Sheet for Patients:  BloggerCourse.com  Fact Sheet for Healthcare Providers:  SeriousBroker.it  This test is no t yet approved or cleared by the [place] FDA and  has been authorized for detection and/or diagnosis of  SARS-CoV-2 by FDA under an Emergency Use Authorization (EUA). This EUA will remain  in effect (meaning this test can be used) for the duration of  the COVID-19 declaration under Section 564(b)(1) of the Act, 21 U.S.C.section 360bbb-3(b)(1), unless the authorization is terminated  or revoked sooner.       Influenza A by PCR NEGATIVE NEGATIVE Final   Influenza B by PCR NEGATIVE NEGATIVE Final    Comment: (NOTE) The Xpert Xpress SARS-CoV-2/FLU/RSV plus assay is intended as an aid in the diagnosis of influenza from Nasopharyngeal swab specimens and should not be used as a sole basis for treatment. Nasal washings and aspirates are unacceptable for Xpert Xpress SARS-CoV-2/FLU/RSV testing.  Fact Sheet for Patients: BloggerCourse.com  Fact Sheet for Healthcare Providers: SeriousBroker.it  This test is not yet approved or cleared by the [place] FDA and has been authorized for detection and/or diagnosis of SARS-CoV-2 by FDA under an Emergency Use Authorization (EUA). This EUA will remain in effect (meaning this test can be used) for the duration of the COVID-19 declaration under Section 564(b)(1) of the Act, 21 U.S.C. section 360bbb-3(b)(1), unless the authorization is terminated or revoked.  Performed at [facility], [address] [place], [place] [address]           Radiology Studies: No results found.      Scheduled Meds:  aspirin  81 mg Oral BID   chlorhexidine  15 mL Mouth Rinse BID   Chlorhexidine Gluconate Cloth  6 each Topical Daily   docusate sodium  100 mg Oral BID   DULoxetine  30 mg Oral Daily   gabapentin  100 mg Oral BID   heparin injection (subcutaneous)  5,000 Units Subcutaneous Q8H   mouth rinse  15 mL Mouth Rinse q12n4p   pantoprazole  80 mg Oral Q1200   potassium chloride  10 mEq Oral Daily   [START ON 05/21/2021] thiamine  100 mg Oral Daily   Continuous Infusions:   sodium chloride 100 mL/hr at 05/19/21 0931   levETIRAcetam 500 mg (05/20/21 0805)   sodium chloride     thiamine injection 500 mg (05/20/21 0857)     LOS: 3 days        Kathlen Mody, MD [facility]   To contact the attending provider between 7A-7P or the covering provider during after hours 7P-7A, please log into the web site [email or web address] and access using universal  password for that web site. If you do not have the password, please call the hospital operator.  05/20/2021, 2:55 PM

## 2021-05-20 NOTE — Care Management Important Message (Signed)
Important Message  Patient Details IM Letter given to the Patient. Name: Paulena Servais MRN: [id number] Date of Birth: 08/28/1950   Medicare Important Message Given:  Yes     Caren Macadam 05/20/2021, 1:14 PM

## 2021-05-20 NOTE — Progress Notes (Signed)
PHYSICAL THERAPY  Pt NOT seen this afternoon.  Was out of room for CT then "really sleepy".  Nursing staff reported pt requested to go back to bed after her morning PT session with c/o nausea.  Only OOB in recliner less that 30 min.  Felecia Shelling  PTA Acute  Rehabilitation Services Pager      [phone] Office      [phone]

## 2021-05-20 NOTE — NC FL2 (Signed)
[place] MEDICAID FL2 LEVEL OF CARE SCREENING TOOL     IDENTIFICATION  Patient Name: Jeanette Bennett [facility] Birthdate: 02-08-1951 Sex: female Admission Date (Current Location): 05/14/2021  [facility] and [place] Number:  Producer, television/film/video and Address:  [facility],  [address] [place], [place] [address]      Provider Number: [id number]  Attending Physician Name and Address:  Kathlen Mody, MD  Relative Name and Phone Number:  [name] (husband) Ph: [phone]    Current Level of Care: Hospital Recommended Level of Care: Skilled Nursing Facility Prior Approval Number:    Date Approved/Denied:   PASRR Number:    Discharge Plan: SNF    Current Diagnoses: Patient Active Problem List   Diagnosis Date Noted   Acute encephalopathy 05/17/2021   Degenerative joint disease of right hip 05/14/2021   Chronic right hip pain 05/03/2021   Primary localized osteoarthritis of right hip 05/03/2021    Orientation RESPIRATION BLADDER Height & Weight     Self, Time, Situation, Place  Normal Incontinent Weight: 228 lb (103.4 kg) Height:  5\' 11"  (180.3 cm)  BEHAVIORAL SYMPTOMS/MOOD NEUROLOGICAL BOWEL NUTRITION STATUS   (N/A)  (N/A) Continent Diet (Regular diet)  AMBULATORY STATUS COMMUNICATION OF NEEDS Skin   Extensive Assist Verbally Surgical wounds                       Personal Care Assistance Level of Assistance  Bathing, Feeding, Dressing Bathing Assistance: Maximum assistance Feeding assistance: Independent Dressing Assistance: Maximum assistance     Functional Limitations Info  Sight, Hearing, Speech Sight Info: Adequate Hearing Info: Adequate Speech Info: Adequate    SPECIAL CARE FACTORS FREQUENCY  PT (By licensed PT), OT (By licensed OT)     PT Frequency: 5x's/week OT Frequency: 5x's/week            Contractures Contractures Info: Not present    Additional Factors Info  Code Status, Allergies, Psychotropic Code Status Info:  Full Allergies Info: Sulfa Antibiotics, Other Psychotropic Info: Cymbalta, Neurontin, Ativan         Current Medications (05/20/2021):  This is the current hospital active medication list Current Facility-Administered Medications  Medication Dose Route Frequency Provider Last Rate Last Admin   0.9 %  sodium chloride infusion   Intravenous Continuous 05/22/2021, MD 100 mL/hr at 05/19/21 0931 Infusion Verify at 05/19/21 0931   aspirin chewable tablet 81 mg  81 mg Oral BID 05/21/21, MD   81 mg at 05/20/21 0806   calcium carbonate (TUMS - dosed in mg elemental calcium) chewable tablet 1,500 mg  1,500 mg Oral TID PRN 05/22/21, MD   1,500 mg at 05/20/21 0901   chlorhexidine (PERIDEX) 0.12 % solution 15 mL  15 mL Mouth Rinse BID 05/22/21, MD   15 mL at 05/20/21 0806   Chlorhexidine Gluconate Cloth 2 % PADS 6 each  6 each Topical Daily 05/22/21, MD   6 each at 05/20/21 0807   docusate sodium (COLACE) capsule 100 mg  100 mg Oral BID 05/22/21, MD   100 mg at 05/20/21 0806   DULoxetine (CYMBALTA) DR capsule 30 mg  30 mg Oral Daily 05/22/21, MD   30 mg at 05/20/21 0806   fentaNYL (SUBLIMAZE) injection 12.5 mcg  12.5 mcg Intravenous Q2H PRN 05/22/21, MD       gabapentin (NEURONTIN) capsule 100 mg  100 mg Oral BID Kathlen Mody, MD   100 mg at 05/20/21 [phone]  gadobutrol (GADAVIST) 1 MMOL/ML injection 10 mL  10 mL Intravenous Once PRN Kathlen Mody, MD       heparin injection 5,000 Units  5,000 Units Subcutaneous Q8H Kathlen Mody, MD   5,000 Units at 05/20/21 0515   hydrOXYzine (ATARAX) tablet 25 mg  25 mg Oral TID PRN Kathlen Mody, MD       ketorolac (TORADOL) 15 MG/ML injection 15 mg  15 mg Intravenous Q8H PRN Kathlen Mody, MD   15 mg at 05/20/21 0644   levETIRAcetam (KEPPRA) IVPB 500 mg/100 mL premix  500 mg Intravenous Q12H Kathlen Mody, MD 400 mL/hr at 05/20/21 0805 500 mg at 05/20/21 0805   LORazepam (ATIVAN) injection 1-2 mg  1-2 mg Intravenous Q2H PRN Kathlen Mody, MD       MEDLINE mouth rinse  15 mL Mouth Rinse q12n4p Kathlen Mody, MD   15 mL at 05/20/21 1152   menthol-cetylpyridinium (CEPACOL) lozenge 3 mg  1 lozenge Oral PRN Kathlen Mody, MD       Or   phenol (CHLORASEPTIC) mouth spray 1 spray  1 spray Mouth/Throat PRN Kathlen Mody, MD       naloxone (NARCAN) injection 0.4 mg  0.4 mg Intravenous PRN Kathlen Mody, MD       ondansetron (ZOFRAN) tablet 4 mg  4 mg Oral Q6H PRN Chadwell, Joshua, PA-C   4 mg at 05/20/21 1151   Or   ondansetron (ZOFRAN) injection 4 mg  4 mg Intravenous Q6H PRN Chadwell, Joshua, PA-C       oxyCODONE-acetaminophen (PERCOCET/ROXICET) 5-325 MG per tablet 1 tablet  1 tablet Oral Q8H PRN Kathlen Mody, MD   1 tablet at 05/20/21 1019   pantoprazole (PROTONIX) EC tablet 80 mg  80 mg Oral Q1200 Kathlen Mody, MD   80 mg at 05/20/21 1151   polyethylene glycol (MIRALAX / GLYCOLAX) packet 17 g  17 g Oral Daily PRN Kathlen Mody, MD       potassium chloride (KLOR-CON M) CR tablet 10 mEq  10 mEq Oral Daily Kathlen Mody, MD   10 mEq at 05/20/21 2637   sodium chloride 0.9 % bolus 250 mL  250 mL Intravenous Once PRN Kathlen Mody, MD       sodium phosphate (FLEET) 7-19 GM/118ML enema 1 enema  1 enema Rectal Once PRN Kathlen Mody, MD       sorbitol 70 % solution 30 mL  30 mL Oral Daily PRN Kathlen Mody, MD   30 mL at 05/18/21 8588   thiamine 500mg  in normal saline (40ml) IVPB  500 mg Intravenous TID 45m, MD 100 mL/hr at 05/20/21 0857 500 mg at 05/20/21 0857   [START ON 05/21/2021] thiamine tablet 100 mg  100 mg Oral Daily 05/23/2021, MD         Discharge Medications: Please see discharge summary for a list of discharge medications.  Relevant Imaging Results:  Relevant Lab Results:   Additional Information    Kathlen Mody, LCSW

## 2021-05-20 NOTE — Progress Notes (Signed)
Physical Therapy Treatment Patient Details Name: Jeanette Bennett MRN: [id number] DOB: 01-30-1951 Today's Date: 05/20/2021   History of Present Illness Patient is 70 y.o. female s/p Rt THA posterior approach on 05/14/21 with pMH significant for anemia, OA, anxiety, depression, HTN, GERD, Rt RCR.    PT Comments    POD # 6 am session General Comments: AxO x 3 much improved with slight groggyness but easily focused. Assisted OOB to amb was very difficult.  General bed mobility comments: demonstarted and instructed how to use a belt to self assist LE but still needed assist with upper body.  General transfer comment: pt required + 2 side by side assist to rise even from an elevated bed.General transfer comment: pt required + 2 side by side assist to rise even from an elevated bed. Performed a few TE's while seated in recliner then applied ICE. Pt is NOT progressing as well as expected and will need ST Rehab at SNF.  Will consult LPT to confirm.   Recommendations for follow up therapy are one component of a multi-disciplinary discharge planning process, led by the attending physician.  Recommendations may be updated based on patient status, additional functional criteria and insurance authorization.  Follow Up Recommendations  Skilled nursing-short term rehab (<3 hours/day)     Assistance Recommended at Discharge    Equipment Recommendations  Rolling walker (2 wheels);BSC/3in1    Recommendations for Other Services       Precautions / Restrictions Precautions Precautions: Fall Precaution Comments: posterior hip precautions Restrictions Weight Bearing Restrictions: No Other Position/Activity Restrictions: WBAT     Mobility  Bed Mobility Overal bed mobility: Needs Assistance Bed Mobility: Supine to Sit     Supine to sit: Max assist     General bed mobility comments: demonstarted and instructed how to use a belt to self assist LE but still needed assist with upper body     Transfers Overall transfer level: Needs assistance Equipment used: Rolling walker (2 wheels);None Transfers: Sit to/from Stand Sit to Stand: Max assist;Mod assist;+2 physical assistance;+2 safety/equipment           General transfer comment: pt required + 2 side by side assist to rise even from an elevated bed.    Ambulation/Gait Ambulation/Gait assistance: Max assist;+2 physical assistance;+2 safety/equipment Gait Distance (Feet): 9 Feet Assistive device: Rolling walker (2 wheels) Gait Pattern/deviations: Step-to pattern;Decreased stance time - right Gait velocity: decreased     General transfer comment: pt required + 2 side by side assist to rise even from an elevated bed.   Stairs             Wheelchair Mobility    Modified Rankin (Stroke Patients Only)       Balance                                            Cognition   Behavior During Therapy: WFL for tasks assessed/performed Overall Cognitive Status: Impaired/Different from baseline                                 General Comments: AxO x 3 much improved with slight groggyness but easily focused.        Exercises  10 reps AP and LAQ's    General Comments        Pertinent Vitals/Pain Pain  Assessment: 0-10 Pain Score: 8  Pain Location: R hip Pain Descriptors / Indicators: Grimacing;Tender;Operative site guarding Pain Intervention(s): Monitored during session;Premedicated before session;Repositioned;Ice applied    Home Living                          Prior Function            PT Goals (current goals can now be found in the care plan section) Progress towards PT goals: Progressing toward goals    Frequency    7X/week      PT Plan Current plan remains appropriate    Co-evaluation              AM-PAC PT "6 Clicks" Mobility   Outcome Measure  Help needed turning from your back to your side while in a flat bed without using  bedrails?: A Lot Help needed moving from lying on your back to sitting on the side of a flat bed without using bedrails?: A Lot Help needed moving to and from a bed to a chair (including a wheelchair)?: A Lot Help needed standing up from a chair using your arms (e.g., wheelchair or bedside chair)?: A Lot Help needed to walk in hospital room?: A Lot Help needed climbing 3-5 steps with a railing? : Total 6 Click Score: 11    End of Session Equipment Utilized During Treatment: Gait belt Activity Tolerance: Patient limited by fatigue;No increased pain Patient left: in chair;with call bell/phone within reach;with nursing/sitter in room Nurse Communication: Mobility status;Need for lift equipment PT Visit Diagnosis: Muscle weakness (generalized) (M62.81);Unsteadiness on feet (R26.81);Difficulty in walking, not elsewhere classified (R26.2);Pain Pain - Right/Left: Right Pain - part of body: Hip     Time: 0952-1020 PT Time Calculation (min) (ACUTE ONLY): 28 min  Charges:  $Gait Training: 8-22 mins $Therapeutic Activity: 8-22 mins                     Felecia Shelling  PTA Acute  Rehabilitation Services Pager      [phone] Office      [phone]

## 2021-05-20 NOTE — Progress Notes (Signed)
°   05/20/21 0944  Assess: MEWS Score  Temp 98.2 F (36.8 C)  BP 129/68  Pulse Rate (!) 102  Resp (!) 22  Level of Consciousness Alert  SpO2 95 %  O2 Device Room Air  Patient Activity (if Appropriate) In bed  Assess: MEWS Score  MEWS Temp 0  MEWS Systolic 0  MEWS Pulse 1  MEWS RR 1  MEWS LOC 0  MEWS Score 2  MEWS Score Color Yellow  Assess: if the MEWS score is Yellow or Red  Were vital signs taken at a resting state? Yes  Focused Assessment No change from prior assessment  Does the patient meet 2 or more of the SIRS criteria? No  MEWS guidelines implemented *See Row Information* Yes  Treat  MEWS Interventions Other (Comment) (pt anxious d/t PT visit and MRI, will continue to monitor)  Pain Scale 0-10  Pain Score 8  Pain Type Surgical pain  Pain Location Hip  Pain Orientation Right  Pain Onset On-going  Patients Stated Pain Goal 0  Pain Intervention(s) Emotional support  Take Vital Signs  Increase Vital Sign Frequency  Yellow: Q 2hr X 2 then Q 4hr X 2, if remains yellow, continue Q 4hrs  Escalate  MEWS: Escalate Yellow: discuss with charge nurse/RN and consider discussing with provider and RRT  Notify: Charge Nurse/RN  Name of Charge Nurse/RN Notified JT, RN  Date Charge Nurse/RN Notified 05/20/21  Time Charge Nurse/RN Notified 1000  Assess: SIRS CRITERIA  SIRS Temperature  0  SIRS Pulse 1  SIRS Respirations  1  SIRS WBC 0  SIRS Score Sum  2    Patient in pain and with anxiety of pending procedures. Consulting civil engineer notified. Will continue to monitor appropriately.

## 2021-05-20 NOTE — TOC Progression Note (Signed)
Transition of Care [facility]) - Progression Note   Patient Details  Name: Jeanette Bennett MRN: [id number] Date of Birth: 01/02/1951  Transition of Care [facility]) CM/SW Contact  Ewing Schlein, LCSW Phone Number: 05/20/2021, 3:13 PM  Clinical Narrative: Due to patient's limited progress with PT, SNF is recommended. Patient was getting an MRI, so CSW spoke with patient's husband, [name], regarding rehab. Per husband, patient will need to go to rehab so that she can function better at home.  FL2 done; PASRR not completed as patient lives in [place]. Initial referral faxed out. TOC awaiting bed offers.  Expected Discharge Plan: Skilled Nursing Facility Barriers to Discharge: SNF Pending bed offer, Insurance Authorization  Expected Discharge Plan and Services Expected Discharge Plan: Skilled Nursing Facility Discharge Planning Services: CM Consult Post Acute Care Choice: Skilled Nursing Facility Living arrangements for the past 2 months: Single Family Home Expected Discharge Date: 05/14/21               DME Arranged: Dan Humphreys rolling DME Agency: [employer] Date DME Agency Contacted: 05/18/21 Time DME Agency Contacted: [phone] Representative spoke with at DME Agency: Jermain  Readmission Risk Interventions No flowsheet data found.

## 2021-05-21 DIAGNOSIS — M1611 Unilateral primary osteoarthritis, right hip: Secondary | ICD-10-CM | POA: Diagnosis not present

## 2021-05-21 LAB — CULTURE, BLOOD (ROUTINE X 2)
Culture: NO GROWTH
Culture: NO GROWTH

## 2021-05-21 MED ORDER — ENSURE ENLIVE PO LIQD
237.0000 mL | ORAL | Status: DC
  Administered 2021-05-21 – 2021-05-22 (×2): 237 mL via ORAL

## 2021-05-21 MED ORDER — THIAMINE HCL 100 MG PO TABS
100.0000 mg | ORAL_TABLET | Freq: Every day | ORAL | Status: DC
  Administered 2021-05-22 – 2021-05-23 (×2): 100 mg via ORAL
  Filled 2021-05-21 (×2): qty 1

## 2021-05-21 NOTE — Progress Notes (Signed)
Initial Nutrition Assessment  DOCUMENTATION CODES:   Obesity unspecified  INTERVENTION:   -Needs updated weight (last recorded 12/12)  -Providing "Weight loss tips" handout in discharge instructions- per pt request  NUTRITION DIAGNOSIS:   Increased nutrient needs related to post-op healing as evidenced by estimated needs.  GOAL:   Patient will meet greater than or equal to 90% of their needs  MONITOR:   PO intake, Supplement acceptance, Labs, Weight trends, I & O's  REASON FOR ASSESSMENT:   Consult Assessment of nutrition requirement/status  ASSESSMENT:   71 year old woman with a history of hypertension, GERD, anxiety/depression, anemia and osteoarthritis.  She underwent a posterior approach right total hip surgery on 05/14/2021.  12/23: admitted, s/p THA of rt hip  Patient in room with husband at bedside, sitting in chair snacking on peanuts. Per pt her appetite has improved, began yesterday. States she has eaten all of her meals. Pt willing to try protein supplements but only wants strawberry flavor, will order Ensure.  Pt requesting information to help with weight loss. Will place handout in d/c instructions. Reviewed some principles briefly at bedside.  Pt reports no issues swallowing or chewing but states her tongue is swollen and hurts from where she bit it during a seizure.   Per weight records, pt's weight recorded as the same on 12/12 and 12/23. Suspect this is not accurate.  Medications: Colace, KLOR-CON, IV thiamine, Miralax  Labs reviewed:  Low Phos  NUTRITION - FOCUSED PHYSICAL EXAM:  No depletions noted.  Diet Order:   Diet Order             Diet regular Room service appropriate? Yes; Fluid consistency: Thin  Diet effective now                   EDUCATION NEEDS:   Education needs have been addressed  Skin:  Skin Assessment: Skin Integrity Issues: Skin Integrity Issues:: Incisions Incisions: rt hip  Last BM:  12/28 -type  6  Height:   Ht Readings from Last 1 Encounters:  05/14/21 5\' 11"  (1.803 m)    Weight:   Wt Readings from Last 1 Encounters:  05/14/21 103.4 kg    BMI:  Body mass index is 31.8 kg/m.  Estimated Nutritional Needs:   Kcal:  2100-2300  Protein:  100-110g  Fluid:  2.1L/day   05/16/21, MS, RD, LDN Inpatient Clinical Dietitian Contact information available via Amion

## 2021-05-21 NOTE — Progress Notes (Signed)
Physical Therapy Treatment Patient Details Name: Jeanette Bennett MRN: [id number] DOB: [birth year] Today's Date: 05/21/2021   History of Present Illness Patient is 70 y.o. female s/p Rt THA posterior approach on 05/14/21 with pMH significant for anemia, OA, anxiety, depression, HTN, GERD, Rt RCR.    PT Comments    POD # 7 pm session Assisted back to bed was very difficult.  General Gait Details: unable to take any functional steps this afternoon due to "other" knee pain/instability. General transfer comment: increased difficulty this afternoon to support herself as her "other" knee is painful and unable to support her.  Has pt stand + 2 assist while recliner was switch out with bed from behind pt. General bed mobility comments: asissted back to bed Total Assist supporting B LE.  Positioned to comfort and applied ICE. Pt will need ST Rehab at SNF prior to returning home.   Recommendations for follow up therapy are one component of a multi-disciplinary discharge planning process, led by the attending physician.  Recommendations may be updated based on patient status, additional functional criteria and insurance authorization.  Follow Up Recommendations  Skilled nursing-short term rehab (<3 hours/day)     Assistance Recommended at Discharge Frequent or constant Supervision/Assistance  Equipment Recommendations       Recommendations for Other Services       Precautions / Restrictions Precautions Precautions: Fall Precaution Comments: posterior hip precautions - pt requires repeat education during each session Restrictions Weight Bearing Restrictions: No Other Position/Activity Restrictions: WBAT     Mobility  Bed Mobility Overal bed mobility: Needs Assistance Bed Mobility: Sit to Supine     Supine to sit: Max assist Sit to supine: Max assist;Total assist   General bed mobility comments: asissted back to bed Total Assist supporting B LE    Transfers Overall transfer  level: Needs assistance Equipment used: Rolling walker (2 wheels);None Transfers: Sit to/from Stand Sit to Stand: Max assist;Mod assist;+2 physical assistance;+2 safety/equipment Stand pivot transfers: Max assist;+2 safety/equipment;Total assist         General transfer comment: increased difficulty this afternoon to support herself as her "other" knee is painful and unable to support her.  Has pt stand + 2 assist while recliner was switch out with bed from behind pt.    Ambulation/Gait Ambulation/Gait assistance: Max assist;+2 physical assistance;+2 safety/equipment Gait Distance (Feet): 1 Feet Assistive device: Rolling walker (2 wheels) Gait Pattern/deviations: Step-to pattern;Decreased stance time - right Gait velocity: decreased     General Gait Details: unable to take any functional steps this afternoon due to "other" knee pain/instability.   Stairs             Wheelchair Mobility    Modified Rankin (Stroke Patients Only)       Balance Overall balance assessment: Needs assistance Sitting-balance support: Feet supported;Single extremity supported Sitting balance-Leahy Scale: Fair Sitting balance - Comments: At edge of recliner during functional ADLs                                    Cognition Arousal/Alertness: Awake/alert Behavior During Therapy: WFL for tasks assessed/performed Overall Cognitive Status: Within Functional Limits for tasks assessed Area of Impairment: Memory;Safety/judgement;Following commands;Attention                   Current Attention Level: Sustained Memory: Decreased short-term memory;Decreased recall of precautions Following Commands: Follows one step commands with increased time Safety/Judgement: Decreased awareness of  safety     General Comments: AxO x 3 appears at prior level of cognition but may lack full insight about how much Rehab she will need.        Exercises      General Comments         Pertinent Vitals/Pain Pain Assessment: 0-10 Pain Score: 8  Faces Pain Scale: Hurts even more Pain Location: "other knee" is hurting today (L knee) Pain Descriptors / Indicators: Grimacing;Tender;Operative site guarding Pain Intervention(s): Repositioned;Ice applied;Monitored during session;Limited activity within patient's tolerance    Home Living                          Prior Function            PT Goals (current goals can now be found in the care plan section) Progress towards PT goals: Progressing toward goals    Frequency    7X/week      PT Plan Current plan remains appropriate    Co-evaluation              AM-PAC PT "6 Clicks" Mobility   Outcome Measure  Help needed turning from your back to your side while in a flat bed without using bedrails?: Total Help needed moving from lying on your back to sitting on the side of a flat bed without using bedrails?: Total Help needed moving to and from a bed to a chair (including a wheelchair)?: Total Help needed standing up from a chair using your arms (e.g., wheelchair or bedside chair)?: Total Help needed to walk in hospital room?: Total Help needed climbing 3-5 steps with a railing? : Total 6 Click Score: 6    End of Session Equipment Utilized During Treatment: Gait belt Activity Tolerance: Patient limited by fatigue;Patient limited by pain Patient left: in bed;with call bell/phone within reach;with bed alarm set Nurse Communication: Mobility status;Need for lift equipment PT Visit Diagnosis: Muscle weakness (generalized) (M62.81);Unsteadiness on feet (R26.81);Difficulty in walking, not elsewhere classified (R26.2);Pain Pain - Right/Left: Left Pain - part of body: Knee     Time: 1335-1400 PT Time Calculation (min) (ACUTE ONLY): 25 min  Charges:  $Gait Training: 8-22 mins $Therapeutic Activity: 23-37 mins                     {Eugen Jeansonne  PTA Acute  Rehabilitation Services Pager       [phone] Office      [phone]

## 2021-05-21 NOTE — Progress Notes (Signed)
Subjective: 7 Days Post-Op Procedure(s) (LRB): TOTAL HIP ARTHROPLASTY (Right) Patient reports pain as mild.    Objective: Vital signs in last 24 hours: Temp:  [97.6 F (36.4 C)-99 F (37.2 C)] 98 F (36.7 C) (12/30 0934) Pulse Rate:  [70-102] 98 (12/30 0934) Resp:  [17-18] 17 (12/30 0934) BP: (101-134)/(47-74) 134/74 (12/30 0934) SpO2:  [93 %-100 %] 93 % (12/30 0934)  Intake/Output from previous day: 12/29 0701 - 12/30 0700 In: 3834.7 [P.O.:780; I.V.:2504.7; IV Piggyback:550] Out: 700 [Urine:700] Intake/Output this shift: Total I/O In: 240 [P.O.:240] Out: 350 [Urine:350]  Recent Labs    05/19/21 0617  HGB 7.7*   Recent Labs    05/19/21 0617  WBC 12.4*  RBC 2.69*  HCT 24.3*  PLT 452*   Recent Labs    05/19/21 0617  NA 139  K 3.5  CL 106  CO2 24  BUN 8  CREATININE 0.61  GLUCOSE 93  CALCIUM 7.5*   Recent Labs    05/18/21 1455  INR 1.1    Neurovascular intact Sensation intact distally Intact pulses distally Dorsiflexion/Plantar flexion intact Incision: dressing C/D/I   Assessment/Plan: 7 Days Post-Op Procedure(s) (LRB): TOTAL HIP ARTHROPLASTY (Right) Up with therapy WBAT bilat LE, post hip precautions R 30 days bid 81mg  ASA for dvt proph Pain control- medicine recommendations for d/c meds, ok with the oxycodone as currently ordered??? F/u [place] 2 weeks post op    Anticipated LOS equal to or greater than 2 midnights due to - Age 72 and older with one or more of the following:  - Obesity  - Expected need for hospital services (PT, OT, Nursing) required for safe  discharge  - Anticipated need for postoperative skilled nursing care or inpatient rehab  - Active co-morbidities: Anemia OR   - Unanticipated findings during/Post Surgery: Slow post-op progression: GI, pain control, mobility and acute encepholopathy    - Patient is a high risk of re-admission due to: None   70 05/21/2021, 10:36 AM

## 2021-05-21 NOTE — Progress Notes (Addendum)
Physical Therapy Treatment Patient Details Name: Jeanette Bennett MRN: [id number] DOB: 1951-03-18 Today's Date: 05/21/2021   History of Present Illness Patient is 70 y.o. female s/p Rt THA posterior approach on 05/14/21 with pMH significant for anemia, OA, anxiety, depression, HTN, GERD, Rt RCR.    PT Comments    POD # 7 am session Her hip and thigh area looks bigger.   General Comments: AxO x 3 much improved with slight groggyness but following all commands. Assisted OOB to attempt amb was difficult.  General bed mobility comments: demonstarted and instructed how to use a belt to self assist LE but still needed assist with upper body.  General transfer comment: pt required + 2 side by side assist to rise even from an elevated bed. Pt c/o her LEFT knee was jurting and not able to support her. General Gait Details: pt c/o of LEFT knee pain and buckling.  barely was able to stand and pivot to recliner.  Pt required + 2 Max/Total Assist. Pt will need ST Rehab at SNF.    Recommendations for follow up therapy are one component of a multi-disciplinary discharge planning process, led by the attending physician.  Recommendations may be updated based on patient status, additional functional criteria and insurance authorization.  Follow Up Recommendations  Skilled nursing-short term rehab (<3 hours/day)     Assistance Recommended at Discharge Frequent or constant Supervision/Assistance  Equipment Recommendations       Recommendations for Other Services       Precautions / Restrictions Precautions Precautions: Fall Precaution Comments: posterior hip precautions - pt requires repeat education during each session Restrictions Weight Bearing Restrictions: No Other Position/Activity Restrictions: WBAT     Mobility  Bed Mobility Overal bed mobility: Needs Assistance Bed Mobility: Supine to Sit     Supine to sit: Max assist Sit to supine: Max assist;+2 for safety/equipment;+2 for  physical assistance   General bed mobility comments: demonstarted and instructed how to use a belt to self assist LE but still needed assist with upper body    Transfers Overall transfer level: Needs assistance Equipment used: Rolling walker (2 wheels);None Transfers: Sit to/from Stand Sit to Stand: Max assist;Mod assist;+2 physical assistance;+2 safety/equipment Stand pivot transfers: Max assist;+2 safety/equipment         General transfer comment: pt required + 2 side by side assist to rise even from an elevated bed. Pt c/o her LEFT knee was jurting and not able to support her.    Ambulation/Gait Ambulation/Gait assistance: Max assist;+2 physical assistance;+2 safety/equipment Gait Distance (Feet): 1 Feet Assistive device: Rolling walker (2 wheels) Gait Pattern/deviations: Step-to pattern;Decreased stance time - right Gait velocity: decreased     General Gait Details: pt c/o of LEFT knee pain and buckling.  barely was able to stand and pivot to recliner.  Pt required + 2 Max/Total Assist.   Stairs             Wheelchair Mobility    Modified Rankin (Stroke Patients Only)       Balance                                            Cognition Arousal/Alertness: Awake/alert Behavior During Therapy: WFL for tasks assessed/performed Overall Cognitive Status: Within Functional Limits for tasks assessed  General Comments: AxO x 3 much improved with slight groggyness but following all commands.        Exercises  10 reps AP, knee presses and gluteal sqeezes    General Comments        Pertinent Vitals/Pain Pain Assessment: 0-10 Pain Score: 8  Pain Location: R hip Pain Descriptors / Indicators: Grimacing;Tender;Operative site guarding Pain Intervention(s): Monitored during session;Premedicated before session;Repositioned;Ice applied    Home Living                          Prior  Function            PT Goals (current goals can now be found in the care plan section) Progress towards PT goals: Progressing toward goals    Frequency    7X/week      PT Plan Current plan remains appropriate    Co-evaluation              AM-PAC PT "6 Clicks" Mobility   Outcome Measure  Help needed turning from your back to your side while in a flat bed without using bedrails?: A Lot Help needed moving from lying on your back to sitting on the side of a flat bed without using bedrails?: A Lot Help needed moving to and from a bed to a chair (including a wheelchair)?: A Lot Help needed standing up from a chair using your arms (e.g., wheelchair or bedside chair)?: A Lot Help needed to walk in hospital room?: Total Help needed climbing 3-5 steps with a railing? : Total 6 Click Score: 10    End of Session Equipment Utilized During Treatment: Gait belt Activity Tolerance: Patient limited by fatigue;No increased pain Patient left: in chair;with call bell/phone within reach;with nursing/sitter in room Nurse Communication: Mobility status;Need for lift equipment PT Visit Diagnosis: Muscle weakness (generalized) (M62.81);Unsteadiness on feet (R26.81);Difficulty in walking, not elsewhere classified (R26.2);Pain Pain - Right/Left: Right Pain - part of body: Hip     Time: 9211-9417 PT Time Calculation (min) (ACUTE ONLY): 29 min  Charges:  $Gait Training: 8-22 mins $Therapeutic Activity: 8-22 mins                     Felecia Shelling  PTA Acute  Rehabilitation Services Pager      [phone] Office      [phone]

## 2021-05-21 NOTE — Progress Notes (Signed)
Occupational Therapy Treatment Patient Details Name: Jeanette Bennett MRN: [id number] DOB: June 02, 1950 Today's Date: 05/21/2021   History of present illness Patient is 70 y.o. female s/p Rt THA posterior approach on 05/14/21 with [place] significant for anemia, OA, anxiety, depression, HTN, GERD, Rt RCR.   OT comments  Patient progressing slowly due to cognitive impairments with poor retention of posterior hip precautions, and RT hip as well as significant LT knee pain. Pt was cooperative to participate in working with OT on adaptive equipment training to increase independence with seated LE dressing, and showed improved ability to perform donning and doffing socks and pants/underwear at recliner level, compared to previous session. Patient remains limited by the aforementioned cognitive and memory impairments, pain, generalized weakness and decreased activity tolerance along with deficits noted below. Pt continues to demonstrate fair rehab potential and would benefit from continued skilled OT in Acute and post-acute to increase safety and independence with ADLs and functional transfers to allow pt to return home safely and reduce caregiver burden and fall risk.    Recommendations for follow up therapy are one component of a multi-disciplinary discharge planning process, led by the attending physician.  Recommendations may be updated based on patient status, additional functional criteria and insurance authorization.    Follow Up Recommendations  Skilled nursing-short term rehab (<3 hours/day)    Assistance Recommended at Discharge Frequent or constant Supervision/Assistance  Equipment Recommendations   (Defer to post-acute recommendations. Pt will likely require Hip kit.)    Recommendations for Other Services      Precautions / Restrictions Precautions Precautions: Fall Precaution Comments: posterior hip precautions - pt requires repeat education during each session Restrictions Weight  Bearing Restrictions: No Other Position/Activity Restrictions: WBAT       Mobility Bed Mobility Overal bed mobility: Needs Assistance Bed Mobility: Supine to Sit     Supine to sit: Max assist Sit to supine: Max assist;+2 for safety/equipment;+2 for physical assistance   General bed mobility comments: demonstarted and instructed how to use a belt to self assist LE but still needed assist with upper body    Transfers Overall transfer level: Needs assistance Equipment used: Rolling walker (2 wheels);None Transfers: Sit to/from Stand Sit to Stand: Max assist;Mod assist;+2 physical assistance;+2 safety/equipment Stand pivot transfers: Max assist;+2 safety/equipment         General transfer comment: pt required + 2 side by side assist to rise even from an elevated bed. Pt c/o her LEFT knee was jurting and not able to support her.     Balance Overall balance assessment: Needs assistance Sitting-balance support: Feet supported;Single extremity supported Sitting balance-Leahy Scale: Fair Sitting balance - Comments: At edge of recliner during functional ADLs                                   ADL either performed or assessed with clinical judgement   ADL                       Lower Body Dressing: Sitting/lateral leans;Minimal assistance;Moderate assistance;Cueing for sequencing;Cueing for safety Lower Body Dressing Details (indicate cue type and reason): Cues for hip precautions throughout, pt was iven demonstration of use of AE for LE dressing. Pt then demonstrated back, doffing socks with Min As and significantly increased time, donning socks with Min As (for placing sock correctly on sock aid) and increased time , and simulated donning and doffing "pants"  over feet by using gait belt as "waist band" with supervision and verbal cues.               General ADL Comments: ADLs practiced at recliner level.    Extremity/Trunk Assessment Upper Extremity  Assessment RUE Deficits / Details: UEs WFL for use of AE and activities performed this date.            Vision Ability to See in Adequate Light: 1 Impaired Additional Comments: Vision impairments vs motor planning impairements with increased difficulty using AE for LE ADLs.   Perception     Praxis Praxis Praxis: Impaired Praxis Impairment Details: Motor planning    Cognition Arousal/Alertness: Awake/alert Behavior During Therapy: WFL for tasks assessed/performed Overall Cognitive Status: No family/caregiver present to determine baseline cognitive functioning Area of Impairment: Memory;Safety/judgement;Following commands;Attention                   Current Attention Level: Sustained Memory: Decreased short-term memory;Decreased recall of precautions Following Commands: Follows one step commands with increased time Safety/Judgement: Decreased awareness of safety     General Comments: Extensive education on posterior hip precautions provided, as pt only able to verbalize 1/3 incompletely at beginning of session. Pt observed looking at menu or TV during education. Pt then asked to repeat precautions back to OT and pt reported "I forgot".  Menu taken away from pt with reasurance it will be returned and TV muted. Pt educated again on all hip precautions. At end of session pt able to verbalize/recall 2/3. Pt blocked x1 from leaning forward past 90 degrees to grasp clothing.          Exercises     Shoulder Instructions       General Comments      Pertinent Vitals/ Pain       Pain Assessment: Faces Pain Score: 8  Faces Pain Scale: Hurts even more Pain Location: LT knee more than RT hip. Pain Descriptors / Indicators: Grimacing;Tender;Operative site guarding Pain Intervention(s): Repositioned;Ice applied;Monitored during session;Limited activity within patient's tolerance  Home Living                                          Prior  Functioning/Environment              Frequency  Min 2X/week        Progress Toward Goals  OT Goals(current goals can now be found in the care plan section)  Progress towards OT goals: Progressing toward goals  Acute Rehab OT Goals Patient Stated Goal: Pt stated no OT relaed goals today, but cooperative with POC. OT Goal Formulation: With patient/family Time For Goal Achievement: 05/30/21 Potential to Achieve Goals: [place]  Plan Discharge plan needs to be updated    Co-evaluation                 AM-PAC OT "6 Clicks" Daily Activity     Outcome Measure   Help from another person eating meals?: A Little Help from another person taking care of personal grooming?: A Little Help from another person toileting, which includes using toliet, bedpan, or urinal?: A Lot Help from another person bathing (including washing, rinsing, drying)?: A Lot Help from another person to put on and taking off regular upper body clothing?: A Lot Help from another person to put on and taking off regular lower body clothing?: A Lot 6 Click  Score: 14    End of Session Equipment Utilized During Treatment: Other (comment) ("Hip kit" equipment.)  OT Visit Diagnosis: Unsteadiness on feet (R26.81);Muscle weakness (generalized) (M62.81);Other symptoms and signs involving cognitive function   Activity Tolerance Patient limited by pain   Patient Left in chair;with call bell/phone within reach;with chair alarm set;with family/visitor present;with nursing/sitter in room   Nurse Communication Other (comment) (HAnd off to CNA)        Time: 1311-1343 OT Time Calculation (min): 32 min  Charges: OT General Charges $OT Visit: 1 Visit OT Treatments $Self Care/Home Management : 23-37 mins  Anderson Malta, [place] Office: [phone] 05/21/2021  Julien Girt 05/21/2021, 1:59 PM

## 2021-05-21 NOTE — TOC Progression Note (Signed)
Transition of Care [facility]) - Progression Note   Patient Details  Name: Jeanette Bennett MRN: [id number] Date of Birth: 1951/03/03  Transition of [facility] CM/SW Contact  Ewing Schlein, LCSW Phone Number: 05/21/2021, 2:59 PM  Clinical Narrative: Patient and husband provided bed offers. Family chose [facility]. CSW spoke with Georgia Duff in admissions at [facility] and confirmed the patient can come tomorrow (05/22/21) or Sunday (05/23/21) if insurance authorization is received. If insurance Berkley Harvey is still pending, patient will need to be admitted during the week.  CSW started insurance authorization on [employer]'s portal. Clinicals uploaded for review. Reference ID # is: [id number]. TOC awaiting insurance authorization.  Expected Discharge Plan: Skilled Nursing Facility Barriers to Discharge: SNF Pending bed offer, Insurance Authorization  Expected Discharge Plan and Services Expected Discharge Plan: Skilled Nursing Facility Discharge Planning Services: CM Consult Post Acute Care Choice: Skilled Nursing Facility Living arrangements for the past 2 months: Single Family Home Expected Discharge Date: 05/14/21               DME Arranged: Dan Humphreys rolling DME Agency: [employer] Date DME Agency Contacted: 05/18/21 Time DME Agency Contacted: [phone] Representative spoke with at DME Agency: [place]  Readmission Risk Interventions No flowsheet data found.

## 2021-05-21 NOTE — Progress Notes (Signed)
PROGRESS NOTE    Jeanette Bennett [facility]  NID:[id number] DOB: 12-Sep-1950 DOA: 05/14/2021 PCP: Lorelei Pont, DO   Brief Narrative: 70 year old female status post right total hip arthroplasty 05/14/2021.  She was admitted by Ortho.  Postop she had hypotension and increasing lethargy.  Patient has history of obstructive sleep apnea and has been on narcotics.  Her O2 saturation was in the 50s.  There was a concern for seizure activity.  Patient was placed on oxygen and Narcan was given and was moved to the ICU.  Assessment & Plan:   Principal Problem:   Degenerative joint disease of right hip Active Problems:   Acute encephalopathy   Acute metabolic encephalopathy resolved secondary to narcotics and obstructive sleep apnea.  Status post acute hypoxic respiratory failure in the setting of OSA and narcotics  resolved.  Seizure activity on Keppra neurology consulted EEG done shows moderate diffuse encephalopathy nonspecific 05/17/2021 B12 and ammonia levels normal  History of anxiety and depression continue home meds  Mildly elevated transaminitis improving right upper quadrant ultrasound no acute findings  Hypertension continue home meds  Right total hip arthroplasty weightbearing as tolerated on the left lower extremity.  On aspirin 81 mg daily.       Nutrition Problem: Increased nutrient needs Etiology: post-op healing     Signs/Symptoms: estimated needs    Interventions: Ensure Enlive (each supplement provides 350kcal and 20 grams of protein), Education  Estimated body mass index is 31.8 kg/m as calculated from the following:   Height as of this encounter: 5\' 11"  (1.803 m).   Weight as of this encounter: 103.4 kg.  DVT prophylaxis: Aspirin  code Status: Full code  family Communication: Discussed with husband at bedside disposition Plan:  Status is: Inpatient  Remains inpatient appropriate because: Await SNF   Consultants:  Ortho PCCM neurology  Procedures:  Right total hip arthroplasty Antimicrobials: None  Subjective: Patient resting in bed husband by the bedside she feels very weak anxious to start walking  Objective: Vitals:   05/21/21 0224 05/21/21 0448 05/21/21 0934 05/21/21 1338  BP: 124/70  134/74 120/69  Pulse: 93 94 98 (!) 101  Resp: 18  17 18   Temp: 97.6 F (36.4 C)  98 F (36.7 C) 98.3 F (36.8 C)  TempSrc: Oral     SpO2: 100% 93% 93% 99%  Weight:      Height:        Intake/Output Summary (Last 24 hours) at 05/21/2021 1535 Last data filed at 05/21/2021 1400 Gross per 24 hour  Intake 3634.74 ml  Output 900 ml  Net 2734.74 ml   Filed Weights   05/14/21 0808 05/14/21 1733  Weight: 103.4 kg 103.4 kg    Examination:  General exam: Appears in no acute distress frail elderly chronically ill looking Respiratory system: Clear to auscultation. Respiratory effort normal. Cardiovascular system: S1 & S2 heard, RRR. No JVD, murmurs, rubs, gallops or clicks. No pedal edema. Gastrointestinal system: Abdomen is nondistended, soft and nontender. No organomegaly or masses felt. Normal bowel sounds heard. Central nervous system: Alert and oriented. No focal neurological deficits. Extremities: 1+ edema pitting bilaterally  skin: No rashes, lesions or ulcers Psychiatry: Judgement and insight appear normal. Mood & affect appropriate.     Data Reviewed: I have personally reviewed following labs and imaging studies  CBC: Recent Labs  Lab 05/15/21 0327 05/16/21 0729 05/16/21 1931 05/17/21 0302 05/18/21 0253 05/19/21 0617  WBC 12.3* 14.0* 14.3* 13.5* 17.4* 12.4*  NEUTROABS 9.5* 10.9*  --   --   --   --  HGB 8.2* 8.1* 7.4* 7.1* 7.5* 7.7*  HCT 26.1* 25.3* 23.3* 22.3* 24.4* 24.3*  MCV 89.1 90.4 90.3 89.6 90.7 90.3  PLT 336 347 308 315 398 452*   Basic Metabolic Panel: Recent Labs  Lab 05/15/21 0327 05/16/21 1931 05/17/21 0302 05/18/21 0253 05/19/21 0617  NA 136 132* 136 137 139  K 3.8 4.3 3.9 3.9 3.5  CL 101 106  106 106 106  CO2 27 22 23 22 24   GLUCOSE 124* 139* 103* 84 93  BUN 16 21 18 11 8   CREATININE 1.04* 0.97 0.83 0.71 0.61  CALCIUM 7.8* 7.0* 7.1* 7.3* 7.5*  MG  --   --   --  1.9 2.1  PHOS  --   --   --   --  1.7*   GFR: Estimated Creatinine Clearance: 84.1 mL/min (by C-G formula based on SCr of 0.61 mg/dL). Liver Function Tests: Recent Labs  Lab 05/17/21 0302 05/18/21 0253 05/19/21 0617  AST 593* 294* 218*  ALT 306* 293* 272*  ALKPHOS 51 69 66  BILITOT 0.8 0.8 0.9  PROT 5.4* 6.4* 6.2*  ALBUMIN 2.3* 2.6* 2.4*   No results for input(s): LIPASE, AMYLASE in the last 168 hours. Recent Labs  Lab 05/18/21 0841  AMMONIA 12   Coagulation Profile: Recent Labs  Lab 05/18/21 1455  INR 1.1   Cardiac Enzymes: No results for input(s): CKTOTAL, CKMB, CKMBINDEX, TROPONINI in the last 168 hours. BNP (last 3 results) No results for input(s): PROBNP in the last 8760 hours. HbA1C: No results for input(s): HGBA1C in the last 72 hours. CBG: Recent Labs  Lab 05/16/21 1742 05/16/21 1753 05/17/21 0718  GLUCAP 140* 123* 87   Lipid Profile: No results for input(s): CHOL, HDL, LDLCALC, TRIG, CHOLHDL, LDLDIRECT in the last 72 hours. Thyroid Function Tests: No results for input(s): TSH, T4TOTAL, FREET4, T3FREE, THYROIDAB in the last 72 hours. Anemia Panel: No results for input(s): VITAMINB12, FOLATE, FERRITIN, TIBC, IRON, RETICCTPCT in the last 72 hours. Sepsis Labs: Recent Labs  Lab 05/16/21 1933 05/17/21 0302  LATICACIDVEN 1.8 1.1    Recent Results (from the past 240 hour(s))  Aerobic/Anaerobic Culture w Gram Stain (surgical/deep wound)     Status: None   Collection Time: 05/14/21 11:00 AM   Specimen: PATH Soft tissue resection  Result Value Ref Range Status   Specimen Description   Final    HIP synovium right hip Performed at [facility], [address] [place], [place]    Special Requests   Final    NONE Performed at [facility], [address], [place], [place]    Gram Stain   Final    RARE WBC PRESENT, PREDOMINANTLY MONONUCLEAR NO ORGANISMS SEEN    Culture   Final    No growth aerobically or anaerobically. Performed at [facility], [address] [place], [address] [place]    Report Status 05/19/2021 FINAL  Final  Aerobic/Anaerobic Culture w Gram Stain (surgical/deep wound)     Status: None   Collection Time: 05/14/21 11:10 AM   Specimen: PATH Bone resection; Tissue  Result Value Ref Range Status   Specimen Description   Final    BONE RIGHT FEMORAL HEAD Performed at [facility], [address], [place], [place]    Special Requests   Final    NONE Performed at [facility], [address], [place], [place]    Gram Stain NO  WBC SEEN NO ORGANISMS SEEN   Final   Culture   Final    No growth aerobically or anaerobically. Performed at [facility], [address] [place], [place] [address]    Report Status 05/19/2021 FINAL  Final  Culture, blood (routine x 2)     Status: None   Collection Time: 05/16/21  7:31 PM   Specimen: BLOOD RIGHT HAND  Result Value Ref Range Status   Specimen Description   Final    BLOOD RIGHT HAND Performed at [facility], [address], [place], [place] [address]    Special Requests   Final    BOTTLES DRAWN AEROBIC ONLY Blood Culture results may not be optimal due to an inadequate volume of blood received in culture bottles Performed at [facility], [address] [place], [place] [address]    Culture   Final    NO GROWTH 5 DAYS Performed at [facility], [address], [place], [place] [address]    Report Status 05/21/2021 FINAL  Final  Culture, blood (routine x 2)     Status: None   Collection Time: 05/16/21  7:31 PM   Specimen: BLOOD  Result Value Ref Range Status   Specimen Description   Final    BLOOD RIGHT  WRIST Performed at [facility], [address], [place], [place] [address]    Special Requests   Final    BOTTLES DRAWN AEROBIC ONLY Blood Culture results may not be optimal due to an inadequate volume of blood received in culture bottles Performed at [facility], [address] [place], [place] [address]    Culture   Final    NO GROWTH 5 DAYS Performed at [facility], [address], [place], [place] [address]    Report Status 05/21/2021 FINAL  Final  MRSA Next Gen by PCR, Nasal     Status: None   Collection Time: 05/16/21  7:41 PM   Specimen: Nasal Mucosa; Nasal Swab  Result Value Ref Range Status   MRSA by PCR Next Gen NOT DETECTED NOT DETECTED Final    Comment: (NOTE) The GeneXpert MRSA Assay (FDA approved for NASAL specimens only), is one component of a comprehensive MRSA colonization surveillance program. It is not intended to diagnose MRSA infection nor to guide or monitor treatment for MRSA infections. Test performance is not FDA approved in patients less than 65 years old. Performed at [facility], [address] [place], [place] [address]   Resp Panel by RT-PCR (Flu A&B, Covid) Nasopharyngeal Swab     Status: None   Collection Time: 05/17/21  6:39 PM   Specimen: Nasopharyngeal Swab; Nasopharyngeal(NP) swabs in vial transport medium  Result Value Ref Range Status   SARS Coronavirus 2 by RT PCR NEGATIVE NEGATIVE Final    Comment: (NOTE) SARS-CoV-2 target nucleic acids are NOT DETECTED.  The SARS-CoV-2 RNA is generally detectable in upper respiratory specimens during the acute phase of infection. The lowest concentration of SARS-CoV-2 viral copies this assay can detect is 138 copies/mL. A negative result does not preclude SARS-Cov-2 infection and should not be used as the sole basis for treatment or other patient management decisions. A negative result may occur with  improper specimen  collection/handling, submission of specimen other than nasopharyngeal swab, presence of viral mutation(s) within the areas targeted by this assay, and inadequate number of viral copies(<138 copies/mL). A negative result must be combined with  clinical observations, patient history, and epidemiological information. The expected result is Negative.  Fact Sheet for Patients:  BloggerCourse.com  Fact Sheet for Healthcare Providers:  SeriousBroker.it  This test is no t yet approved or cleared by the [place] FDA and  has been authorized for detection and/or diagnosis of SARS-CoV-2 by FDA under an Emergency Use Authorization (EUA). This EUA will remain  in effect (meaning this test can be used) for the duration of the COVID-19 declaration under Section 564(b)(1) of the Act, 21 U.S.C.section 360bbb-3(b)(1), unless the authorization is terminated  or revoked sooner.       Influenza A by PCR NEGATIVE NEGATIVE Final   Influenza B by PCR NEGATIVE NEGATIVE Final    Comment: (NOTE) The Xpert Xpress SARS-CoV-2/FLU/RSV plus assay is intended as an aid in the diagnosis of influenza from Nasopharyngeal swab specimens and should not be used as a sole basis for treatment. Nasal washings and aspirates are unacceptable for Xpert Xpress SARS-CoV-2/FLU/RSV testing.  Fact Sheet for Patients: BloggerCourse.com  Fact Sheet for Healthcare Providers: SeriousBroker.it  This test is not yet approved or cleared by the [place] FDA and has been authorized for detection and/or diagnosis of SARS-CoV-2 by FDA under an Emergency Use Authorization (EUA). This EUA will remain in effect (meaning this test can be used) for the duration of the COVID-19 declaration under Section 564(b)(1) of the Act, 21 U.S.C. section 360bbb-3(b)(1), unless the authorization is terminated or revoked.  Performed at [facility], [address] [place], [place] [address]          Radiology Studies: MR BRAIN WO CONTRAST  Result Date: 05/20/2021 CLINICAL DATA:  Mental status change, recent total knee replacement EXAM: MRI HEAD WITHOUT CONTRAST TECHNIQUE: Multiplanar, multiecho pulse sequences of the brain and surrounding structures were obtained without intravenous contrast. COMPARISON:  No prior MRI, 05/16/2021 CTA head and neck FINDINGS: Evaluation is somewhat limited by motion artifact. Brain: No restricted diffusion to suggest acute or subacute infarct. No acute hemorrhage, mass, mass effect, or midline shift. No hydrocephalus or extra-axial collection. Vascular: Normal flow voids. Skull and upper cervical spine: Degenerative changes in the cervical spine, which are poorly evaluated given motion. Normal marrow signal. Sinuses/Orbits: Negative. Other: Fluid in the right mastoid air cells. IMPRESSION: Evaluation is somewhat limited by motion artifact. Within this limitation, no acute intracranial process. No etiology is seen for the patient's altered mental status. Electronically Signed   By: Wiliam Ke M.D.   On: 05/20/2021 14:56        Scheduled Meds:  aspirin  81 mg Oral BID   chlorhexidine  15 mL Mouth Rinse BID   Chlorhexidine Gluconate Cloth  6 each Topical Daily   docusate sodium  100 mg Oral BID   DULoxetine  30 mg Oral Daily   feeding supplement  237 mL Oral Q24H   gabapentin  100 mg Oral BID   heparin injection (subcutaneous)  5,000 Units Subcutaneous Q8H   mouth rinse  15 mL Mouth Rinse q12n4p   pantoprazole  80 mg Oral Q1200   potassium chloride  10 mEq Oral Daily   [START ON 05/22/2021] thiamine  100 mg Oral Daily   Continuous Infusions:  levETIRAcetam 500 mg (05/21/21 0824)   sodium chloride     thiamine injection 500 mg (05/21/21 1011)     LOS: 4 days   40 min Time spent:    Alwyn Ren, MD  05/21/2021, 3:35 PM

## 2021-05-22 DIAGNOSIS — M1611 Unilateral primary osteoarthritis, right hip: Secondary | ICD-10-CM | POA: Diagnosis not present

## 2021-05-22 DIAGNOSIS — G9340 Encephalopathy, unspecified: Secondary | ICD-10-CM | POA: Diagnosis not present

## 2021-05-22 MED ORDER — POLYETHYLENE GLYCOL 3350 17 G PO PACK
17.0000 g | PACK | Freq: Two times a day (BID) | ORAL | Status: DC
  Administered 2021-05-22 – 2021-05-23 (×2): 17 g via ORAL
  Filled 2021-05-22 (×2): qty 1

## 2021-05-22 MED ORDER — OXYCODONE-ACETAMINOPHEN 5-325 MG PO TABS
1.0000 | ORAL_TABLET | ORAL | Status: DC | PRN
Start: 2021-05-22 — End: 2021-05-24
  Administered 2021-05-22 (×2): 2 via ORAL
  Administered 2021-05-23 (×3): 1 via ORAL
  Filled 2021-05-22 (×2): qty 2
  Filled 2021-05-22 (×3): qty 1

## 2021-05-22 MED ORDER — BISACODYL 5 MG PO TBEC
10.0000 mg | DELAYED_RELEASE_TABLET | Freq: Every day | ORAL | Status: DC
  Administered 2021-05-22 – 2021-05-23 (×2): 10 mg via ORAL
  Filled 2021-05-22 (×2): qty 2

## 2021-05-22 MED ORDER — SORBITOL 70 % SOLN
30.0000 mL | Freq: Every day | Status: DC
  Administered 2021-05-23: 30 mL via ORAL
  Filled 2021-05-22: qty 30

## 2021-05-22 MED ORDER — LEVETIRACETAM 500 MG PO TABS
500.0000 mg | ORAL_TABLET | Freq: Two times a day (BID) | ORAL | Status: DC
  Administered 2021-05-22 – 2021-05-23 (×3): 500 mg via ORAL
  Filled 2021-05-22 (×3): qty 1

## 2021-05-22 NOTE — Progress Notes (Signed)
Neurology Progress Note Jeanette Bennett [facility] MR# [id number] 05/22/2021   S: no overnight events; no new complaints. She has not had any further events.   O: Current vital signs: BP (!) 111/59 (BP Location: Right Arm)    Pulse (!) 101    Temp 98.3 F (36.8 C)    Resp 16    Ht 5\' 11"  (1.803 m)    Wt 103.4 kg    SpO2 95%    BMI 31.80 kg/m  Vital signs in last 24 hours: Temp:  [98.3 F (36.8 C)-99.6 F (37.6 C)] 98.3 F (36.8 C) (12/31 1312) Pulse Rate:  [101] 101 (12/31 1312) Resp:  [16-20] 16 (12/31 1312) BP: (102-151)/(54-73) 111/59 (12/31 1312) SpO2:  [93 %-96 %] 95 % (12/31 1312) GENERAL: Awake, alert in NAD HEENT: Normocephalic and atraumatic, moist mm, no LN++, no thyromegaly LUNGS: symmetric excursions bilaterally with no audible wheezes. CV: RR, equal pulses bilaterally. ABDOMEN: Soft, nontender, nondistended with normoactive BS Ext: warm, well perfused, intact peripheral pulses  NEURO:  Mental Status: AA&Ox3  Language: speech is .  Intact naming, repetition, and comprehension. PERR. EOMI, visual fields full, no facial asymmetry, facial sensation intact, hearing intact. No evidence of tongue atrophy or fibrillations, tongue/uvula/soft palate midline elevates symmetrically  Normal sternocleidomastoid and trapezius muscle strength. Motor:  strength in all extremities. Tone: Tone and bulk is normal Sensation: Intact to light touch bilaterally Coordination: FTN intact bilaterally, no ataxia in BLE. Gait - Deferred  Medications  Current Facility-Administered Medications:    aspirin chewable tablet 81 mg, 81 mg, Oral, BID, 05-04-1982, MD, 81 mg at 05/22/21 0936   bisacodyl (DULCOLAX) EC tablet 10 mg, 10 mg, Oral, Daily, 05/24/21, MD, 10 mg at 05/22/21 1156   calcium carbonate (TUMS - dosed in mg elemental calcium) chewable tablet 1,500 mg, 1,500 mg, Oral, TID PRN, 05/24/21, MD, 1,500 mg at 05/20/21 0901   chlorhexidine (PERIDEX) 0.12 % solution 15 mL,  15 mL, Mouth Rinse, BID, 05/22/21, MD, 15 mL at 05/22/21 0935   Chlorhexidine Gluconate Cloth 2 % PADS 6 each, 6 each, Topical, Daily, 05/24/21, MD, 6 each at 05/22/21 0936   docusate sodium (COLACE) capsule 100 mg, 100 mg, Oral, BID, 05/24/21, MD, 100 mg at 05/22/21 0936   DULoxetine (CYMBALTA) DR capsule 30 mg, 30 mg, Oral, Daily, 05/24/21, MD, 30 mg at 05/22/21 0936   feeding supplement (ENSURE ENLIVE / ENSURE PLUS) liquid 237 mL, 237 mL, Oral, Q24H, 05/24/21, MD, 237 mL at 05/22/21 1400   gabapentin (NEURONTIN) capsule 100 mg, 100 mg, Oral, BID, 05/24/21, MD, 100 mg at 05/22/21 0935   gadobutrol (GADAVIST) 1 MMOL/ML injection 10 mL, 10 mL, Intravenous, Once PRN, 05/24/21, MD   heparin injection 5,000 Units, 5,000 Units, Subcutaneous, Q8H, Kathlen Mody, MD, 5,000 Units at 05/22/21 1400   hydrOXYzine (ATARAX) tablet 25 mg, 25 mg, Oral, TID PRN, 05/24/21, MD   levETIRAcetam (KEPPRA) tablet 500 mg, 500 mg, Oral, BID, Kathlen Mody, MD, 500 mg at 05/22/21 1156   LORazepam (ATIVAN) injection 1-2 mg, 1-2 mg, Intravenous, Q2H PRN, 05/24/21, MD   MEDLINE mouth rinse, 15 mL, Mouth Rinse, q12n4p, Kathlen Mody, Vijaya, MD, 15 mL at 05/22/21 1615   menthol-cetylpyridinium (CEPACOL) lozenge 3 mg, 1 lozenge, Oral, PRN **OR** phenol (CHLORASEPTIC) mouth spray 1 spray, 1 spray, Mouth/Throat, PRN, 05/24/21, MD   naloxone (NARCAN) injection 0.4 mg, 0.4 mg, Intravenous, PRN, Kathlen Mody, MD   ondansetron (  ZOFRAN) tablet 4 mg, 4 mg, Oral, Q6H PRN, 4 mg at 05/20/21 1151 **OR** ondansetron (ZOFRAN) injection 4 mg, 4 mg, Intravenous, Q6H PRN, Chadwell, Joshua, PA-C, 4 mg at 05/22/21 1155   oxyCODONE-acetaminophen (PERCOCET/ROXICET) 5-325 MG per tablet 1-2 tablet, 1-2 tablet, Oral, Q4H PRN, Alwyn Ren, MD, 2 tablet at 05/22/21 1613   pantoprazole (PROTONIX) EC tablet 80 mg, 80 mg, Oral, Q1200, Kathlen Mody, MD, 80 mg at 05/22/21 1200   polyethylene  glycol (MIRALAX / GLYCOLAX) packet 17 g, 17 g, Oral, BID, Alwyn Ren, MD   potassium chloride (KLOR-CON M) CR tablet 10 mEq, 10 mEq, Oral, Daily, Kathlen Mody, MD, 10 mEq at 05/22/21 0935   sodium chloride 0.9 % bolus 250 mL, 250 mL, Intravenous, Once PRN, Kathlen Mody, MD   sodium phosphate (FLEET) 7-19 GM/118ML enema 1 enema, 1 enema, Rectal, Once PRN, Kathlen Mody, MD   [START ON 05/23/2021] sorbitol 70 % solution 30 mL, 30 mL, Oral, Daily, Alwyn Ren, MD   thiamine tablet 100 mg, 100 mg, Oral, Daily, Alwyn Ren, MD, 100 mg at 05/22/21 0936 Labs     Component Value Date/Time   WBC 12.4 (H) 05/19/2021 0617   RBC 2.69 (L) 05/19/2021 0617   HGB 7.7 (L) 05/19/2021 0617   HCT 24.3 (L) 05/19/2021 0617   PLT 452 (H) 05/19/2021 0617   MCV 90.3 05/19/2021 0617   MCH 28.6 05/19/2021 0617   MCHC 31.7 05/19/2021 0617   RDW 15.3 05/19/2021 0617   LYMPHSABS 1.8 05/16/2021 0729   MONOABS 1.1 (H) 05/16/2021 0729   EOSABS 0.0 05/16/2021 0729   BASOSABS 0.1 05/16/2021 0729       Component Value Date/Time   NA 139 05/19/2021 0617   K 3.5 05/19/2021 0617   CL 106 05/19/2021 0617   CO2 24 05/19/2021 0617   GLUCOSE 93 05/19/2021 0617   BUN 8 05/19/2021 0617   CREATININE 0.61 05/19/2021 0617   CALCIUM 7.5 (L) 05/19/2021 0617   PROT 6.2 (L) 05/19/2021 0617   ALBUMIN 2.4 (L) 05/19/2021 0617   AST 218 (H) 05/19/2021 0617   ALT 272 (H) 05/19/2021 0617   ALKPHOS 66 05/19/2021 0617   BILITOT 0.9 05/19/2021 0617   GFRNONAA >60 05/19/2021 0617    EEG with continuous generalized slowing. Findings are suggestive of moderate diffuse encephalopathy, nonspecific to etiology but could be secondary to toxic-metabolic causes. No seizures or definite epileptiform discharges were seen throughout the recording.  Assessment: 70 year old female  status post right total hip arthroplasty 05/14/2021 who developed postop encephalopathy with seizure-like episode. EEG did not show  epileptiform activity and MRI showed no acute intracranial process. She and her husband state that she is back to baseline.   Recommendations: There is no objective evidence to start any antiseizure medication. Continue to monitor. Please call for questions.  Electronically signed by:  Marisue Humble, MD Page: [phone] 05/22/2021, 8:28 PM

## 2021-05-22 NOTE — Progress Notes (Signed)
Physical Therapy Treatment Patient Details Name: Jeanette Bennett MRN: [id number] DOB: May 28, 1950 Today's Date: 05/22/2021   History of Present Illness Patient is 70 y.o. female s/p Rt THA posterior approach on 05/14/21 with pMH significant for anemia, OA, anxiety, depression, HTN, GERD, Rt RCR.    PT Comments    Pt continues cooperative but limited to performing therex and standing at EOB 2* nausea and pain.     Recommendations for follow up therapy are one component of a multi-disciplinary discharge planning process, led by the attending physician.  Recommendations may be updated based on patient status, additional functional criteria and insurance authorization.  Follow Up Recommendations  Skilled nursing-short term rehab (<3 hours/day)     Assistance Recommended at Discharge Frequent or constant Supervision/Assistance  Equipment Recommendations  Rolling walker (2 wheels);BSC/3in1    Recommendations for Other Services OT consult     Precautions / Restrictions Precautions Precautions: Fall Precaution Comments: posterior hip precautions - pt requires repeat education during each session Restrictions Weight Bearing Restrictions: No Other Position/Activity Restrictions: WBAT     Mobility  Bed Mobility Overal bed mobility: Needs Assistance Bed Mobility: Supine to Sit;Sit to Supine     Supine to sit: Min assist;+2 for physical assistance;+2 for safety/equipment Sit to supine: Max assist;+2 for physical assistance;+2 for safety/equipment   General bed mobility comments: cues for sequence and adherence to THP; use of bedrails and min assist to exit bed but max assist to return to bed    Transfers Overall transfer level: Needs assistance Equipment used: Rolling walker (2 wheels);None Transfers: Sit to/from Stand Sit to Stand: Mod assist;+2 physical assistance;+2 safety/equipment;From elevated surface           General transfer comment: Pt stood only, unable to  complete step 2* bilat LE pain    Ambulation/Gait               General Gait Details: Pt stood only to allow bedding change; unable to step with either LE 2* pain and ongoing nausea.   Stairs             Wheelchair Mobility    Modified Rankin (Stroke Patients Only)       Balance                                            Cognition Arousal/Alertness: Awake/alert Behavior During Therapy: WFL for tasks assessed/performed;Flat affect Overall Cognitive Status: Within Functional Limits for tasks assessed                                          Exercises Total Joint Exercises Ankle Circles/Pumps: AROM;15 reps;Supine;Both;AAROM Quad Sets: Both;10 reps;Supine;AAROM;AROM Heel Slides: AAROM;Right;20 reps;Supine Hip ABduction/ADduction: AAROM;Right;15 reps;Supine    General Comments        Pertinent Vitals/Pain Pain Assessment: 0-10 Pain Score: 8     Home Living                          Prior Function            PT Goals (current goals can now be found in the care plan section) Acute Rehab PT Goals Patient Stated Goal: Regain IND PT Goal Formulation: With patient Time For Goal Achievement: 05/21/21 Potential to  Achieve Goals: Good Progress towards PT goals: Not progressing toward goals - comment (ltd this date by nause, R knee pain and L hip pain)    Frequency    7X/week      PT Plan Current plan remains appropriate    Co-evaluation              AM-PAC PT "6 Clicks" Mobility   Outcome Measure  Help needed turning from your back to your side while in a flat bed without using bedrails?: Total Help needed moving from lying on your back to sitting on the side of a flat bed without using bedrails?: A Lot Help needed moving to and from a bed to a chair (including a wheelchair)?: A Lot Help needed standing up from a chair using your arms (e.g., wheelchair or bedside chair)?: A Lot Help needed to  walk in hospital room?: Total Help needed climbing 3-5 steps with a railing? : Total 6 Click Score: 9    End of Session Equipment Utilized During Treatment: Gait belt Activity Tolerance: Patient limited by fatigue;Patient limited by pain Patient left: in bed;with call bell/phone within reach;with bed alarm set Nurse Communication: Mobility status;Need for lift equipment PT Visit Diagnosis: Muscle weakness (generalized) (M62.81);Unsteadiness on feet (R26.81);Difficulty in walking, not elsewhere classified (R26.2);Pain Pain - Right/Left: Left Pain - part of body: Knee     Time: 1135-1208 PT Time Calculation (min) (ACUTE ONLY): 33 min  Charges:  $Therapeutic Exercise: 8-22 mins $Therapeutic Activity: 8-22 mins                     Mauro Kaufmann PT Acute Rehabilitation Services Pager [phone] Office [phone]    Jeanette Bennett 05/22/2021, 12:46 PM

## 2021-05-22 NOTE — Progress Notes (Signed)
PROGRESS NOTE    Kerri-Anne Haeberle [facility]  FIE:[id number] DOB: 1950-12-25 DOA: 05/14/2021 PCP: Lorelei Pont, DO   Brief Narrative: 70 year old female status post right total hip arthroplasty 05/14/2021.  She was admitted by Ortho.  Postop she had hypotension and increasing lethargy.  Patient has history of obstructive sleep apnea and has been on narcotics.  Her O2 saturation was in the 50s.  There was a concern for seizure activity.  Patient was placed on oxygen and Narcan was given and was moved to the ICU.  Assessment & Plan:   Principal Problem:   Degenerative joint disease of right hip Active Problems:   Acute encephalopathy   Acute metabolic encephalopathy resolved secondary to narcotics and obstructive sleep apnea.  Status post acute hypoxic respiratory failure in the setting of OSA and narcotics  resolved.  Seizure activity on Keppra neurology consulted EEG done shows moderate diffuse encephalopathy nonspecific 05/17/2021 B12 and ammonia levels normal Change Keppra to p.o. since she is able to swallow and able to take p.o.  Plan is to taper down Keppra slowly.  History of anxiety and depression continue home meds  Mildly elevated transaminitis improving right upper quadrant ultrasound no acute findings  Hypertension continue home meds  Right total hip arthroplasty weightbearing as tolerated on the left lower extremity.  On aspirin 81 mg daily.  Pain control with Percocet 1 to 2 tablets every 4 as needed.  Constipation increase stool softeners MiraLAX to twice daily       Nutrition Problem: Increased nutrient needs Etiology: post-op healing     Signs/Symptoms: estimated needs    Interventions: Ensure Enlive (each supplement provides 350kcal and 20 grams of protein), Education  Estimated body mass index is 31.8 kg/m as calculated from the following:   Height as of this encounter: 5\' 11"  (1.803 m).   Weight as of this encounter: 103.4 kg.  DVT prophylaxis:  Aspirin  code Status: Full code  family Communication: Discussed with husband at bedside disposition Plan:  Status is: Inpatient  Remains inpatient appropriate because: Await SNF   Consultants:  Ortho PCCM neurology  Procedures: Right total hip arthroplasty Antimicrobials: None  Subjective: She is complaining of pain not controlled with current pain medication which is Percocet 1 tablet every 8 as needed.  She is also complaining of constipation. Objective: Vitals:   05/21/21 1338 05/21/21 2100 05/22/21 0542 05/22/21 1312  BP: 120/69 (!) 102/54 (!) 151/73 (!) 111/59  Pulse: (!) 101 (!) 101 (!) 101 (!) 101  Resp: 18 20 16 16   Temp: 98.3 F (36.8 C) 99.6 F (37.6 C) 98.7 F (37.1 C) 98.3 F (36.8 C)  TempSrc:  Oral Oral   SpO2: 99% 93% 96% 95%  Weight:      Height:        Intake/Output Summary (Last 24 hours) at 05/22/2021 1337 Last data filed at 05/22/2021 1200 Gross per 24 hour  Intake 1290 ml  Output 1600 ml  Net -310 ml    Filed Weights   05/14/21 0808 05/14/21 1733  Weight: 103.4 kg 103.4 kg    Examination:  General exam: Appears in no acute distress frail elderly chronically ill looking Respiratory system: Clear to auscultation. Respiratory effort normal. Cardiovascular system: S1 & S2 heard, RRR. No JVD, murmurs, rubs, gallops or clicks. No pedal edema. Gastrointestinal system: Abdomen is nondistended, soft and nontender. No organomegaly or masses felt. Normal bowel sounds heard. Central nervous system: Alert and oriented. No focal neurological deficits. Extremities: 1+ edema pitting bilaterally  skin: No rashes, lesions or ulcers Psychiatry: Judgement and insight appear normal. Mood & affect appropriate.     Data Reviewed: I have personally reviewed following labs and imaging studies  CBC: Recent Labs  Lab 05/16/21 0729 05/16/21 1931 05/17/21 0302 05/18/21 0253 05/19/21 0617  WBC 14.0* 14.3* 13.5* 17.4* 12.4*  NEUTROABS 10.9*  --   --   --    --   HGB 8.1* 7.4* 7.1* 7.5* 7.7*  HCT 25.3* 23.3* 22.3* 24.4* 24.3*  MCV 90.4 90.3 89.6 90.7 90.3  PLT 347 308 315 398 452*    Basic Metabolic Panel: Recent Labs  Lab 05/16/21 1931 05/17/21 0302 05/18/21 0253 05/19/21 0617  NA 132* 136 137 139  K 4.3 3.9 3.9 3.5  CL 106 106 106 106  CO2 22 23 22 24   GLUCOSE 139* 103* 84 93  BUN 21 18 11 8   CREATININE 0.97 0.83 0.71 0.61  CALCIUM 7.0* 7.1* 7.3* 7.5*  MG  --   --  1.9 2.1  PHOS  --   --   --  1.7*    GFR: Estimated Creatinine Clearance: 84.1 mL/min (by C-G formula based on SCr of 0.61 mg/dL). Liver Function Tests: Recent Labs  Lab 05/17/21 0302 05/18/21 0253 05/19/21 0617  AST 593* 294* 218*  ALT 306* 293* 272*  ALKPHOS 51 69 66  BILITOT 0.8 0.8 0.9  PROT 5.4* 6.4* 6.2*  ALBUMIN 2.3* 2.6* 2.4*    No results for input(s): LIPASE, AMYLASE in the last 168 hours. Recent Labs  Lab 05/18/21 0841  AMMONIA 12    Coagulation Profile: Recent Labs  Lab 05/18/21 1455  INR 1.1    Cardiac Enzymes: No results for input(s): CKTOTAL, CKMB, CKMBINDEX, TROPONINI in the last 168 hours. BNP (last 3 results) No results for input(s): PROBNP in the last 8760 hours. HbA1C: No results for input(s): HGBA1C in the last 72 hours. CBG: Recent Labs  Lab 05/16/21 1742 05/16/21 1753 05/17/21 0718  GLUCAP 140* 123* 87    Lipid Profile: No results for input(s): CHOL, HDL, LDLCALC, TRIG, CHOLHDL, LDLDIRECT in the last 72 hours. Thyroid Function Tests: No results for input(s): TSH, T4TOTAL, FREET4, T3FREE, THYROIDAB in the last 72 hours. Anemia Panel: No results for input(s): VITAMINB12, FOLATE, FERRITIN, TIBC, IRON, RETICCTPCT in the last 72 hours. Sepsis Labs: Recent Labs  Lab 05/16/21 1933 05/17/21 0302  LATICACIDVEN 1.8 1.1     Recent Results (from the past 240 hour(s))  Aerobic/Anaerobic Culture w Gram Stain (surgical/deep wound)     Status: None   Collection Time: 05/14/21 11:00 AM   Specimen: PATH Soft tissue  resection  Result Value Ref Range Status   Specimen Description   Final    HIP synovium right hip Performed at [facility], [address], [place], [place]    Special Requests   Final    NONE Performed at [facility], [address], [place], [place]    Gram Stain   Final    RARE WBC PRESENT, PREDOMINANTLY MONONUCLEAR NO ORGANISMS SEEN    Culture   Final    No growth aerobically or anaerobically. Performed at [facility], [address] [place], [address] [place]    Report Status 05/19/2021 FINAL  Final  Aerobic/Anaerobic Culture w Gram Stain (surgical/deep wound)     Status: None   Collection Time: 05/14/21 11:10 AM   Specimen: PATH Bone resection; Tissue  Result Value Ref Range Status  Specimen Description   Final    BONE RIGHT FEMORAL HEAD Performed at [facility], [address], [place], [place] [address]    Special Requests   Final    NONE Performed at [facility], [address], [place], [place] [address]    Gram Stain NO WBC SEEN NO ORGANISMS SEEN   Final   Culture   Final    No growth aerobically or anaerobically. Performed at [facility], [address] [place], [place] [address]    Report Status 05/19/2021 FINAL  Final  Culture, blood (routine x 2)     Status: None   Collection Time: 05/16/21  7:31 PM   Specimen: BLOOD RIGHT HAND  Result Value Ref Range Status   Specimen Description   Final    BLOOD RIGHT HAND Performed at [facility], [address], [place], [place] [address]    Special Requests   Final    BOTTLES DRAWN AEROBIC ONLY Blood Culture results may not be optimal due to an inadequate volume of blood received in culture bottles Performed at [facility], [address] [place], [place] [address]    Culture   Final    NO GROWTH 5 DAYS Performed at [facility], [address], [place], [place] [address]    Report Status 05/21/2021 FINAL  Final  Culture, blood (routine x 2)     Status: None   Collection Time: 05/16/21  7:31 PM   Specimen: BLOOD  Result Value Ref Range Status   Specimen Description   Final    BLOOD RIGHT WRIST Performed at [facility], [address], [place], [place] [address]    Special Requests   Final    BOTTLES DRAWN AEROBIC ONLY Blood Culture results may not be optimal due to an inadequate volume of blood received in culture bottles Performed at [facility], [address], [place], [place] [address]    Culture   Final    NO GROWTH 5 DAYS Performed at [facility], [address], [place], [place] [address]    Report Status 05/21/2021 FINAL  Final  MRSA Next Gen by PCR, Nasal     Status: None   Collection Time: 05/16/21  7:41 PM   Specimen: Nasal Mucosa; Nasal Swab  Result Value Ref Range Status   MRSA by PCR Next Gen NOT DETECTED NOT DETECTED Final    Comment: (NOTE) The GeneXpert MRSA Assay (FDA approved for NASAL specimens only), is one component of a comprehensive MRSA colonization surveillance program. It is not intended to diagnose MRSA infection nor to guide or monitor treatment for MRSA infections. Test performance is not FDA approved in patients less than 64 years old. Performed at [facility], [address] [place], [place] [address]   Resp Panel by RT-PCR (Flu A&B, Covid) Nasopharyngeal Swab     Status: None   Collection Time: 05/17/21  6:39 PM   Specimen: Nasopharyngeal Swab; Nasopharyngeal(NP) swabs in vial transport medium  Result Value Ref Range Status   SARS Coronavirus 2 by RT PCR NEGATIVE NEGATIVE Final    Comment: (NOTE) SARS-CoV-2 target nucleic acids are NOT DETECTED.  The SARS-CoV-2 RNA is generally detectable in upper respiratory specimens during the acute phase of infection. The lowest concentration of SARS-CoV-2 viral copies this  assay can detect is 138 copies/mL. A negative result does not preclude SARS-Cov-2 infection and should  not be used as the sole basis for treatment or other patient management decisions. A negative result may occur with  improper specimen collection/handling, submission of specimen other than nasopharyngeal swab, presence of viral mutation(s) within the areas targeted by this assay, and inadequate number of viral copies(<138 copies/mL). A negative result must be combined with clinical observations, patient history, and epidemiological information. The expected result is Negative.  Fact Sheet for Patients:  BloggerCourse.com  Fact Sheet for Healthcare Providers:  SeriousBroker.it  This test is no t yet approved or cleared by the [place] FDA and  has been authorized for detection and/or diagnosis of SARS-CoV-2 by FDA under an Emergency Use Authorization (EUA). This EUA will remain  in effect (meaning this test can be used) for the duration of the COVID-19 declaration under Section 564(b)(1) of the Act, 21 U.S.C.section 360bbb-3(b)(1), unless the authorization is terminated  or revoked sooner.       Influenza A by PCR NEGATIVE NEGATIVE Final   Influenza B by PCR NEGATIVE NEGATIVE Final    Comment: (NOTE) The Xpert Xpress SARS-CoV-2/FLU/RSV plus assay is intended as an aid in the diagnosis of influenza from Nasopharyngeal swab specimens and should not be used as a sole basis for treatment. Nasal washings and aspirates are unacceptable for Xpert Xpress SARS-CoV-2/FLU/RSV testing.  Fact Sheet for Patients: BloggerCourse.com  Fact Sheet for Healthcare Providers: SeriousBroker.it  This test is not yet approved or cleared by the [place] FDA and has been authorized for detection and/or diagnosis of SARS-CoV-2 by FDA under an Emergency Use Authorization (EUA). This EUA will  remain in effect (meaning this test can be used) for the duration of the COVID-19 declaration under Section 564(b)(1) of the Act, 21 U.S.C. section 360bbb-3(b)(1), unless the authorization is terminated or revoked.  Performed at [facility], [address] [place], [place] [address]           Radiology Studies: MR BRAIN WO CONTRAST  Result Date: 05/20/2021 CLINICAL DATA:  Mental status change, recent total knee replacement EXAM: MRI HEAD WITHOUT CONTRAST TECHNIQUE: Multiplanar, multiecho pulse sequences of the brain and surrounding structures were obtained without intravenous contrast. COMPARISON:  No prior MRI, 05/16/2021 CTA head and neck FINDINGS: Evaluation is somewhat limited by motion artifact. Brain: No restricted diffusion to suggest acute or subacute infarct. No acute hemorrhage, mass, mass effect, or midline shift. No hydrocephalus or extra-axial collection. Vascular: Normal flow voids. Skull and upper cervical spine: Degenerative changes in the cervical spine, which are poorly evaluated given motion. Normal marrow signal. Sinuses/Orbits: Negative. Other: Fluid in the right mastoid air cells. IMPRESSION: Evaluation is somewhat limited by motion artifact. Within this limitation, no acute intracranial process. No etiology is seen for the patient's altered mental status. Electronically Signed   By: Wiliam Ke M.D.   On: 05/20/2021 14:56        Scheduled Meds:  aspirin  81 mg Oral BID   bisacodyl  10 mg Oral Daily   chlorhexidine  15 mL Mouth Rinse BID   Chlorhexidine Gluconate Cloth  6 each Topical Daily   docusate sodium  100 mg Oral BID   DULoxetine  30 mg Oral Daily   feeding supplement  237 mL Oral Q24H   gabapentin  100 mg Oral BID   heparin injection (subcutaneous)  5,000 Units Subcutaneous Q8H   levETIRAcetam  500 mg Oral BID   mouth rinse  15 mL Mouth Rinse q12n4p   pantoprazole  80 mg Oral Q1200  polyethylene glycol  17 g Oral BID    potassium chloride  10 mEq Oral Daily   [START ON 05/23/2021] sorbitol  30 mL Oral Daily   thiamine  100 mg Oral Daily   Continuous Infusions:  sodium chloride       LOS: 5 days   40 min Time spent:    Alwyn Ren, MD  05/22/2021, 1:37 PM

## 2021-05-23 DIAGNOSIS — M1611 Unilateral primary osteoarthritis, right hip: Secondary | ICD-10-CM | POA: Diagnosis not present

## 2021-05-23 LAB — RESP PANEL BY RT-PCR (FLU A&B, COVID) ARPGX2
Influenza A by PCR: NEGATIVE
Influenza B by PCR: NEGATIVE
SARS Coronavirus 2 by RT PCR: NEGATIVE

## 2021-05-23 MED ORDER — POTASSIUM CHLORIDE CRYS ER 10 MEQ PO TBCR: 10.0000 meq | EXTENDED_RELEASE_TABLET | Freq: Every day | ORAL | Status: AC

## 2021-05-23 MED ORDER — BISACODYL 10 MG RE SUPP
10.0000 mg | Freq: Once | RECTAL | Status: AC
  Administered 2021-05-23: 10 mg via RECTAL
  Filled 2021-05-23: qty 1

## 2021-05-23 MED ORDER — BISACODYL 5 MG PO TBEC: 10.0000 mg | DELAYED_RELEASE_TABLET | Freq: Every day | ORAL | 0 refills | Status: DC

## 2021-05-23 MED ORDER — POLYETHYLENE GLYCOL 3350 17 G PO PACK: 17.0000 g | PACK | Freq: Two times a day (BID) | ORAL | 0 refills | Status: DC

## 2021-05-23 MED ORDER — THIAMINE HCL 100 MG PO TABS: 100.0000 mg | ORAL_TABLET | Freq: Every day | ORAL | Status: DC

## 2021-05-23 MED ORDER — OXYCODONE-ACETAMINOPHEN 5-325 MG PO TABS
1.0000 | ORAL_TABLET | ORAL | 0 refills | Status: DC | PRN
Start: 2021-05-23 — End: 2022-03-10

## 2021-05-23 NOTE — Progress Notes (Signed)
SPORTS MEDICINE AND JOINT REPLACEMENT  Georgena Spurling, MD    Laurier Nancy, PA-C [address] [place], [place], [place]  [address]                             [phone]   PROGRESS NOTE  Subjective:  negative for Chest Pain  negative for Shortness of Breath  negative for Nausea/Vomiting   negative for Calf Pain  negative for Bowel Movement   Tolerating Diet: yes         Patient reports pain as 3 on 0-10 scale.    Objective: Vital signs in last 24 hours:   Patient Vitals for the past 24 hrs:  BP Temp Temp src Pulse Resp SpO2  05/23/21 0420 124/69 97.7 F (36.5 C) Oral 93 15 93 %  05/23/21 0417 -- -- -- -- -- (!) 77 %  05/22/21 2206 105/60 98.2 F (36.8 C) Oral (!) 102 14 97 %  05/22/21 1312 (!) 111/59 98.3 F (36.8 C) -- (!) 101 16 95 %    @flow {1959:LAST@   Intake/Output from previous day:   12/31 0701 - 01/01 0700 In: 250 [P.O.:150] Out: 1400 [Urine:1400]   Intake/Output this shift:   No intake/output data recorded.   Intake/Output      12/31 0701 01/01 0700 01/01 0701 01/02 0700   P.O. 150    IV Piggyback 100    Total Intake(mL/kg) 250 (2.4)    Urine (mL/kg/hr) 1400 (0.6)    Stool     Total Output 1400    Net -1150         Urine Occurrence 2 x       LABORATORY DATA: Recent Labs    05/16/21 1931 05/17/21 0302 05/18/21 0253 05/19/21 0617  WBC 14.3* 13.5* 17.4* 12.4*  HGB 7.4* 7.1* 7.5* 7.7*  HCT 23.3* 22.3* 24.4* 24.3*  PLT 308 315 398 452*   Recent Labs    05/16/21 1931 05/17/21 0302 05/18/21 0253 05/19/21 0617  NA 132* 136 137 139  K 4.3 3.9 3.9 3.5  CL 106 106 106 106  CO2 22 23 22 24   BUN 21 18 11 8   CREATININE 0.97 0.83 0.71 0.61  GLUCOSE 139* 103* 84 93  CALCIUM 7.0* 7.1* 7.3* 7.5*   Lab Results  Component Value Date   INR 1.1 05/18/2021    Examination:  General appearance: alert, cooperative, and no distress Extremities: extremities normal, atraumatic, no cyanosis or edema  Wound Exam: clean, dry, intact   Drainage:   None: wound tissue dry  Motor Exam: Quadriceps and Hamstrings Intact  Sensory Exam: Superficial Peroneal, Deep Peroneal, and Tibial normal   Assessment:    9 Days Post-Op  Procedure(s) (LRB): TOTAL HIP ARTHROPLASTY (Right)  ADDITIONAL DIAGNOSIS:  Principal Problem:   Degenerative joint disease of right hip Active Problems:   Acute encephalopathy     Plan: Physical Therapy as ordered Weight Bearing as Tolerated (WBAT)  DVT Prophylaxis:  Aspirin  DISCHARGE PLAN: Skilled Nursing Facility/Rehab  Patient doing well, resting comfortably in bed and stable for D/C once SNF bed available.        Anticipated LOS equal to or greater than 2 midnights due to - Age 73 and older with one or more of the following:  - Obesity  - Expected need for hospital services (PT, OT, Nursing) required for safe  discharge  - Anticipated need for postoperative skilled nursing care or inpatient rehab    -  Unanticipated findings during/Post Surgery: Slow post-op progression: GI, pain control, mobility  - Patient is a high risk of re-admission due to: None    Guy Sandifer 05/23/2021, 7:51 AM

## 2021-05-23 NOTE — Discharge Summary (Signed)
Physician Discharge Summary  Jeanette Bennett XBJ:[id number] DOB: 03/12/1951 DOA: 05/14/2021  PCP: Lorelei Pont, DO  Admit date: 05/14/2021 Discharge date: 05/23/2021  Admitted From: Home Disposition: Nursing home  Recommendations for Outpatient Follow-up:  Follow up with PCP in 1-2 weeks Please obtain BMP/CBC in one week Please follow up with Ortho  Home Health: None Equipment/Devices: None  Discharge Condition: Stable CODE STATUS: Full Diet recommendation: Cardiac Brief/Interim Summary: 71 year old female status post right total hip arthroplasty 05/14/2021.  She was admitted by Ortho.  Postop she had hypotension and increasing lethargy.  Patient has history of obstructive sleep apnea and has been on narcotics.  Her O2 saturation was in the 50s.  There was a concern for seizure activity.  Patient was placed on oxygen and Narcan was given and was moved to the ICU.   Discharge Diagnoses:  Principal Problem:   Degenerative joint disease of right hip Active Problems:   Acute encephalopathy  Acute metabolic encephalopathy resolved secondary to narcotics and obstructive sleep apnea.  There was a concern for seizure activity when she was very hypoxic.  She was seen by neurology.  She was treated with Keppra for few days during the hospital stay.  However her EEG did not show any epileptiform activities had moderate diffuse encephalopathy.  Keppra was stopped on discharge.  MRI of the brain showed no acute intracranial process.   Status post acute hypoxic respiratory failure in the setting of OSA and narcotics  resolved.   History of anxiety and depression continue home meds   Mildly elevated transaminitis improving right upper quadrant ultrasound no acute findings   Hypertension continue home meds   Right total hip arthroplasty weightbearing as tolerated on the left lower extremity.  On aspirin 81 mg daily.  Pain control with Percocet 1 to 2 tablets every 4 as needed.    Constipation increase stool softeners MiraLAX to twice daily        Nutrition Problem: Increased nutrient needs Etiology: post-op healing    Signs/Symptoms: estimated needs     Interventions: Ensure Enlive (each supplement provides 350kcal and 20 grams of protein), Education  Estimated body mass index is 31.8 kg/m as calculated from the following:   Height as of this encounter: 5\' 11"  (1.803 m).   Weight as of this encounter: 103.4 kg.  Discharge Instructions  Discharge Instructions     Diet - low sodium heart healthy   Complete by: As directed    Increase activity slowly   Complete by: As directed    No wound care   Complete by: As directed       Allergies as of 05/23/2021       Reactions   Other Shortness Of Breath   Tree Nuts   Sulfa Antibiotics Nausea And Vomiting, Rash        Medication List     TAKE these medications    acetaminophen 650 MG CR tablet Commonly known as: TYLENOL Take 1,300 mg by mouth every 8 (eight) hours as needed for pain.   alendronate 70 MG tablet Commonly known as: FOSAMAX Take 70 mg by mouth every Monday. Take with a full glass of water on an empty stomach.   amLODipine 10 MG tablet Commonly known as: NORVASC Take 10 mg by mouth daily.   aspirin EC 81 MG tablet Take 1 tablet (81 mg total) by mouth 2 (two) times daily. TO PREVENT BLOOD CLOTS   atorvastatin 80 MG tablet Commonly known as: LIPITOR Take 40 mg by  mouth daily.   bisacodyl 5 MG EC tablet Commonly known as: DULCOLAX Take 2 tablets (10 mg total) by mouth daily. Start taking on: May 24, 2021   calcium carbonate 500 MG chewable tablet Commonly known as: TUMS - dosed in mg elemental calcium Chew 1,500 mg by mouth 3 (three) times daily as needed for indigestion or heartburn.   colchicine 0.6 MG tablet Take 0.6-1.2 mg by mouth See admin instructions. Take 1.2 mg at onset of gout flare then take 0.6 mg 1 hour later as needed for gout   diclofenac Sodium  1 % Gel Commonly known as: VOLTAREN Apply 1 application topically 4 (four) times daily.   docusate sodium 100 MG capsule Commonly known as: Colace Take 1 capsule (100 mg total) by mouth daily as needed.   DULoxetine 30 MG capsule Commonly known as: CYMBALTA Take 30 mg by mouth daily.   esomeprazole 40 MG capsule Commonly known as: NEXIUM Take 40 mg by mouth daily.   gabapentin 100 MG capsule Commonly known as: NEURONTIN Take 100 mg by mouth 2 (two) times daily.   loratadine 10 MG tablet Commonly known as: CLARITIN Take 10 mg by mouth daily.   multivitamin with minerals Tabs tablet Take 1 tablet by mouth daily.   Nebivolol HCl 20 MG Tabs Take 20 mg by mouth daily.   oxyCODONE-acetaminophen 5-325 MG tablet Commonly known as: PERCOCET/ROXICET Take 1-2 tablets by mouth every 4 (four) hours as needed for moderate pain.   polyethylene glycol 17 g packet Commonly known as: MIRALAX / GLYCOLAX Take 17 g by mouth 2 (two) times daily.   potassium chloride 10 MEQ tablet Commonly known as: KLOR-CON M Take 10 mEq by mouth daily. What changed: Another medication with the same name was added. Make sure you understand how and when to take each.   potassium chloride 10 MEQ tablet Commonly known as: KLOR-CON M Take 1 tablet (10 mEq total) by mouth daily. Start taking on: May 24, 2021 What changed: You were already taking a medication with the same name, and this prescription was added. Make sure you understand how and when to take each.   thiamine 100 MG tablet Take 1 tablet (100 mg total) by mouth daily. Start taking on: May 24, 2021   triamterene-hydrochlorothiazide 75-50 MG tablet Commonly known as: MAXZIDE Take 1 tablet by mouth daily.   valsartan 160 MG tablet Commonly known as: DIOVAN Take 160 mg by mouth daily.   Vitamin D (Ergocalciferol) 1.25 MG (50000 UNIT) Caps capsule Commonly known as: DRISDOL Take 50,000 Units by mouth every Monday.                Durable Medical Equipment  (From admission, onward)           Start     Ordered   05/14/21 1401  DME Walker rolling  Once       Question Answer Comment  Walker: With 5 Inch Wheels   Patient needs a walker to treat with the following condition Degenerative joint disease of right hip      05/14/21 1400            Contact information for follow-up providers     Frederico Hamman, MD. Schedule an appointment as soon as possible for a visit in 2 week(s).   Specialty: Orthopedic Surgery Why: or as previously scheduled Contact information: [address] [place] [address] [phone]         Lorelei Pont, DO Follow  up.   Specialty: Family Medicine Contact information: [address] [place] [address] [phone]              Contact information for after-discharge care     Destination     [facility] .   Service: Skilled Nursing Contact information: [address] [place] [address] [phone]                    Allergies  Allergen Reactions   Other Shortness Of Breath    Tree Nuts   Sulfa Antibiotics Nausea And Vomiting and Rash    Consultations: Ortho Neurology   Procedures/Studies: CT ANGIO HEAD NECK W WO CM  Result Date: 05/16/2021 CLINICAL DATA:  Neuro deficit, acute, stroke suspected. Sudden onset of unresponsiveness. EXAM: CT ANGIOGRAPHY HEAD AND NECK TECHNIQUE: Multidetector CT imaging of the head and neck was performed using the standard protocol during bolus administration of intravenous contrast. Multiplanar CT image reconstructions and MIPs were obtained to evaluate the vascular anatomy. Carotid stenosis measurements (when applicable) are obtained utilizing NASCET criteria, using the distal internal carotid diameter as the denominator. CONTRAST:  80mL OMNIPAQUE IOHEXOL 350 MG/ML SOLN COMPARISON:  None. FINDINGS: CT HEAD FINDINGS Brain: Age related atrophy.  Chronic small-vessel ischemic change of the white matter. No sign of acute infarction, mass lesion, hemorrhage, hydrocephalus or extra-axial collection. Vascular: There is atherosclerotic calcification of the major vessels at the base of the brain. Skull: Negative Sinuses: Clear/normal Orbits: None Review of the MIP images confirms the above findings CTA NECK FINDINGS Aortic arch: Aortic atherosclerosis.  Branching pattern is normal. Right carotid system: Common carotid artery widely patent to the bifurcation. Carotid bifurcation is normal. Cervical ICA is normal. Left carotid system: Common carotid artery widely patent to the bifurcation. Mild calcified plaque at the bifurcation and ICA bulb but no stenosis. Cervical ICA normal beyond that. Vertebral arteries: Both vertebral artery origins are normal. Both vertebral arteries are normal through the cervical region to the foramen magnum. Skeleton: Chronic cervical spondylosis.  No acute finding. Other neck: No mass or lymphadenopathy. Upper chest: Question mild interstitial edema. Mild dependent pulmonary atelectasis. Review of the MIP images confirms the above findings CTA HEAD FINDINGS Anterior circulation: Both internal carotid arteries are patent through the skull base and siphon regions. Ordinary siphon atherosclerotic calcification but without stenosis greater than 30%. The anterior and middle cerebral vessels are normal except for a mild stenosis of the distal M1 segment on the right. No large vessel occlusion. No proximal stenosis. No aneurysm or vascular malformation. Posterior circulation: Both vertebral arteries are widely patent through the foramen magnum to the basilar. No basilar stenosis. Posterior circulation branch vessels are patent. Venous sinuses: Patent and normal. Anatomic variants: None significant. Review of the MIP images confirms the above findings IMPRESSION: No large vessel occlusion. No flow limiting atherosclerotic stenotic disease. Mild  stenosis of the distal M1 segment on the right. No acute head CT finding. Electronically Signed   By: Paulina Fusi M.D.   On: 05/16/2021 21:08   MR BRAIN WO CONTRAST  Result Date: 05/20/2021 CLINICAL DATA:  Mental status change, recent total knee replacement EXAM: MRI HEAD WITHOUT CONTRAST TECHNIQUE: Multiplanar, multiecho pulse sequences of the brain and surrounding structures were obtained without intravenous contrast. COMPARISON:  No prior MRI, 05/16/2021 CTA head and neck FINDINGS: Evaluation is somewhat limited by motion artifact. Brain: No restricted diffusion to suggest acute or subacute infarct. No acute hemorrhage, mass, mass effect, or midline  shift. No hydrocephalus or extra-axial collection. Vascular: Normal flow voids. Skull and upper cervical spine: Degenerative changes in the cervical spine, which are poorly evaluated given motion. Normal marrow signal. Sinuses/Orbits: Negative. Other: Fluid in the right mastoid air cells. IMPRESSION: Evaluation is somewhat limited by motion artifact. Within this limitation, no acute intracranial process. No etiology is seen for the patient's altered mental status. Electronically Signed   By: Wiliam Ke M.D.   On: 05/20/2021 14:56   DG Pelvis Portable  Result Date: 05/14/2021 CLINICAL DATA:  Post right total hip replacement EXAM: PORTABLE PELVIS 1-2 VIEWS COMPARISON:  Right hip radiograph-earlier same day FINDINGS: Post right total hip replacement. Alignment appears anatomic given AP projection. No definite fracture or dislocation. Limited visualization of the pelvis suggests moderate degenerative change of the contralateral left hip, incompletely evaluated. Subcutaneous emphysema is seen about the operative site. No radiopaque foreign body. IMPRESSION: Post right total hip replacement without evidence of complication. Electronically Signed   By: Simonne Come M.D.   On: 05/14/2021 14:53   DG Chest Port 1 View  Result Date: 05/16/2021 CLINICAL DATA:   Acute respiratory failure with hypoxemia EXAM: PORTABLE CHEST 1 VIEW COMPARISON:  None. FINDINGS: Enlarged cardiac silhouette with central vascular prominence. No overt pulmonary edema. Low lung volumes with bibasilar atelectasis. No visible pleural effusion or pneumothorax. No acute osseous abnormality. IMPRESSION: 1. Enlarged cardiac silhouette with central vascular prominence. No overt pulmonary edema. 2. Low lung volumes with bibasilar atelectasis. 3.  Aortic Atherosclerosis (ICD10-I70.0). Electronically Signed   By: Maudry Mayhew M.D.   On: 05/16/2021 19:20   EEG adult  Result Date: 05/17/2021 Charlsie Quest, MD     05/17/2021  4:23 PM Patient Name: Taci Sterling MRN: [id number] Epilepsy Attending: Charlsie Quest Referring Physician/Provider: Erick Blinks, MD Date: 05/17/2021 Duration: 23.08 mins Patient history: 71yo F with ams. EEG to evaluate for seizure. Level of alertness: Awake AEDs during EEG study: None Technical aspects: This EEG study was done with scalp electrodes positioned according to the 10-20 International system of electrode placement. Electrical activity was acquired at a sampling rate of 500Hz  and reviewed with a high frequency filter of 70Hz  and a low frequency filter of 1Hz . EEG data were recorded continuously and digitally stored. Description: EEG showed continuous generalized 3 to 6 Hz theta-delta slowing, at times with triphasic morphology. Hyperventilation and photic stimulation were not performed.   ABNORMALITY - Continuous slow, generalized IMPRESSION: This study is suggestive of moderate diffuse encephalopathy, nonspecific etiology but could be secondary to toxic-metabolic causes. No seizures or definite epileptiform discharges were seen throughout the recording. Charlsie Quest   DG Hip Port Unilat With Pelvis 1V Right  Result Date: 05/14/2021 CLINICAL DATA:  Right total hip replacement. EXAM: DG HIP (WITH OR WITHOUT PELVIS) 1V PORT RIGHT COMPARISON:   Pelvic radiograph-earlier same day FINDINGS: Post right total hip replacement. Alignment appears anatomic given AP projection. No definite fracture or dislocation. Subcutaneous emphysema is seen about the operative site. No radiopaque foreign body. IMPRESSION: Post right total hip replacement without evidence of complication. Electronically Signed   By: Simonne Come M.D.   On: 05/14/2021 14:53   US Abdomen Limited RUQ (LIVER/GB)  Result Date: 05/17/2021 CLINICAL DATA:  Transaminitis. EXAM: ULTRASOUND ABDOMEN LIMITED RIGHT UPPER QUADRANT COMPARISON:  None. FINDINGS: Gallbladder: No gallstones or gallbladder wall thickening. No pericholecystic fluid. The sonographer reports no sonographic Murphy's sign. Common bile duct: Diameter: 3 mm Liver: No focal lesion identified. Within normal limits in parenchymal  echogenicity. Portal vein is patent on color Doppler imaging with normal direction of blood flow towards the liver. Other: None. IMPRESSION: Unremarkable right upper quadrant ultrasound. Electronically Signed   By: Kennith CenterEric  Mansell M.D.   On: 05/17/2021 14:26   (Echo, Carotid, EGD, Colonoscopy, ERCP)    Subjective:  Awake alert  C/o constipation no nausea   Discharge Exam: Vitals:   05/23/21 0417 05/23/21 0420  BP:  124/69  Pulse:  93  Resp:  15  Temp:  97.7 F (36.5 C)  SpO2: (!) 77% 93%   Vitals:   05/22/21 1312 05/22/21 2206 05/23/21 0417 05/23/21 0420  BP: (!) 111/59 105/60  124/69  Pulse: (!) 101 (!) 102  93  Resp: 16 14  15   Temp: 98.3 F (36.8 C) 98.2 F (36.8 C)  97.7 F (36.5 C)  TempSrc:  Oral  Oral  SpO2: 95% 97% (!) 77% 93%  Weight:      Height:        General: Pt is alert, awake, not in acute distress Cardiovascular: RRR, S1/S2 +, no rubs, no gallops Respiratory: CTA bilaterally, no wheezing, no rhonchi Abdominal: Soft, NT, ND, bowel sounds + Extremities: 1 plus edema     The results of significant diagnostics from this hospitalization (including imaging,  microbiology, ancillary and laboratory) are listed below for reference.     Microbiology: Recent Results (from the past 240 hour(s))  Aerobic/Anaerobic Culture w Gram Stain (surgical/deep wound)     Status: None   Collection Time: 05/14/21 11:00 AM   Specimen: PATH Soft tissue resection  Result Value Ref Range Status   Specimen Description   Final    HIP synovium right hip Performed at [facility], [address] [place], [place] [address]    Special Requests   Final    NONE Performed at [facility], [address] [place], [place] [address]    Gram Stain   Final    RARE WBC PRESENT, PREDOMINANTLY MONONUCLEAR NO ORGANISMS SEEN    Culture   Final    No growth aerobically or anaerobically. Performed at [facility], [address] [place], [place] [address]    Report Status 05/19/2021 FINAL  Final  Aerobic/Anaerobic Culture w Gram Stain (surgical/deep wound)     Status: None   Collection Time: 05/14/21 11:10 AM   Specimen: PATH Bone resection; Tissue  Result Value Ref Range Status   Specimen Description   Final    BONE RIGHT FEMORAL HEAD Performed at [facility], [address] [place], [place] [address]    Special Requests   Final    NONE Performed at [facility], [address] [place], [place] [address]    Gram Stain NO WBC SEEN NO ORGANISMS SEEN   Final   Culture   Final    No growth aerobically or anaerobically. Performed at [facility], [address] [place], [place] [address]    Report Status 05/19/2021 FINAL  Final  Culture, blood (routine x 2)     Status: None   Collection Time: 05/16/21  7:31 PM   Specimen: BLOOD RIGHT HAND  Result Value Ref Range Status   Specimen Description   Final    BLOOD RIGHT HAND Performed at [facility], [address] [place], [place] [address]    Special Requests   Final    BOTTLES DRAWN AEROBIC ONLY Blood  Culture results may not be  optimal due to an inadequate volume of blood received in culture bottles Performed at [facility], [address] [place], [place] [address]    Culture   Final    NO GROWTH 5 DAYS Performed at [facility], [address] [place], [place] [address]    Report Status 05/21/2021 FINAL  Final  Culture, blood (routine x 2)     Status: None   Collection Time: 05/16/21  7:31 PM   Specimen: BLOOD  Result Value Ref Range Status   Specimen Description   Final    BLOOD RIGHT WRIST Performed at [facility], [address] [place], [place] [address]    Special Requests   Final    BOTTLES DRAWN AEROBIC ONLY Blood Culture results may not be optimal due to an inadequate volume of blood received in culture bottles Performed at [facility], [address] [place], [place] [address]    Culture   Final    NO GROWTH 5 DAYS Performed at [facility], [address] [place], [place] [address]    Report Status 05/21/2021 FINAL  Final  MRSA Next Gen by PCR, Nasal     Status: None   Collection Time: 05/16/21  7:41 PM   Specimen: Nasal Mucosa; Nasal Swab  Result Value Ref Range Status   MRSA by PCR Next Gen NOT DETECTED NOT DETECTED Final    Comment: (NOTE) The GeneXpert MRSA Assay (FDA approved for NASAL specimens only), is one component of a comprehensive MRSA colonization surveillance program. It is not intended to diagnose MRSA infection nor to guide or monitor treatment for MRSA infections. Test performance is not FDA approved in patients less than 73 years old. Performed at [facility], [address] [place], [place] [address]   Resp Panel by RT-PCR (Flu A&B, Covid) Nasopharyngeal Swab     Status: None   Collection Time: 05/17/21  6:39 PM   Specimen: Nasopharyngeal Swab; Nasopharyngeal(NP) swabs in vial transport medium  Result Value Ref Range Status   SARS Coronavirus 2  by RT PCR NEGATIVE NEGATIVE Final    Comment: (NOTE) SARS-CoV-2 target nucleic acids are NOT DETECTED.  The SARS-CoV-2 RNA is generally detectable in upper respiratory specimens during the acute phase of infection. The lowest concentration of SARS-CoV-2 viral copies this assay can detect is 138 copies/mL. A negative result does not preclude SARS-Cov-2 infection and should not be used as the sole basis for treatment or other patient management decisions. A negative result may occur with  improper specimen collection/handling, submission of specimen other than nasopharyngeal swab, presence of viral mutation(s) within the areas targeted by this assay, and inadequate number of viral copies(<138 copies/mL). A negative result must be combined with clinical observations, patient history, and epidemiological information. The expected result is Negative.  Fact Sheet for Patients:  BloggerCourse.comhttps://www.fda.gov/media/152166/download  Fact Sheet for Healthcare Providers:  SeriousBroker.ithttps://www.fda.gov/media/152162/download  This test is no t yet approved or cleared by the [place] FDA and  has been authorized for detection and/or diagnosis of SARS-CoV-2 by FDA under an Emergency Use Authorization (EUA). This EUA will remain  in effect (meaning this test can be used) for the duration of the COVID-19 declaration under Section 564(b)(1) of the Act, 21 U.S.C.section 360bbb-3(b)(1), unless the authorization is terminated  or revoked sooner.       Influenza A by PCR NEGATIVE NEGATIVE Final   Influenza B by PCR NEGATIVE NEGATIVE Final  Comment: (NOTE) The Xpert Xpress SARS-CoV-2/FLU/RSV plus assay is intended as an aid in the diagnosis of influenza from Nasopharyngeal swab specimens and should not be used as a sole basis for treatment. Nasal washings and aspirates are unacceptable for Xpert Xpress SARS-CoV-2/FLU/RSV testing.  Fact Sheet for Patients: BloggerCourse.com  Fact  Sheet for Healthcare Providers: SeriousBroker.it  This test is not yet approved or cleared by the [place] FDA and has been authorized for detection and/or diagnosis of SARS-CoV-2 by FDA under an Emergency Use Authorization (EUA). This EUA will remain in effect (meaning this test can be used) for the duration of the COVID-19 declaration under Section 564(b)(1) of the Act, 21 U.S.C. section 360bbb-3(b)(1), unless the authorization is terminated or revoked.  Performed at [facility], [address], [place], [place] [address]      Labs: BNP (last 3 results) No results for input(s): BNP in the last 8760 hours. Basic Metabolic Panel: Recent Labs  Lab 05/16/21 1931 05/17/21 0302 05/18/21 0253 05/19/21 0617  NA 132* 136 137 139  K 4.3 3.9 3.9 3.5  CL 106 106 106 106  CO2 22 23 22 24   GLUCOSE 139* 103* 84 93  BUN 21 18 11 8   CREATININE 0.97 0.83 0.71 0.61  CALCIUM 7.0* 7.1* 7.3* 7.5*  MG  --   --  1.9 2.1  PHOS  --   --   --  1.7*   Liver Function Tests: Recent Labs  Lab 05/17/21 0302 05/18/21 0253 05/19/21 0617  AST 593* 294* 218*  ALT 306* 293* 272*  ALKPHOS 51 69 66  BILITOT 0.8 0.8 0.9  PROT 5.4* 6.4* 6.2*  ALBUMIN 2.3* 2.6* 2.4*   No results for input(s): LIPASE, AMYLASE in the last 168 hours. Recent Labs  Lab 05/18/21 0841  AMMONIA 12   CBC: Recent Labs  Lab 05/16/21 1931 05/17/21 0302 05/18/21 0253 05/19/21 0617  WBC 14.3* 13.5* 17.4* 12.4*  HGB 7.4* 7.1* 7.5* 7.7*  HCT 23.3* 22.3* 24.4* 24.3*  MCV 90.3 89.6 90.7 90.3  PLT 308 315 398 452*   Cardiac Enzymes: No results for input(s): CKTOTAL, CKMB, CKMBINDEX, TROPONINI in the last 168 hours. BNP: Invalid input(s): POCBNP CBG: Recent Labs  Lab 05/16/21 1742 05/16/21 1753 05/17/21 0718  GLUCAP 140* 123* 87   D-Dimer No results for input(s): DDIMER in the last 72 hours. Hgb A1c No results for input(s): HGBA1C in the last 72 hours. Lipid  Profile No results for input(s): CHOL, HDL, LDLCALC, TRIG, CHOLHDL, LDLDIRECT in the last 72 hours. Thyroid function studies No results for input(s): TSH, T4TOTAL, T3FREE, THYROIDAB in the last 72 hours.  Invalid input(s): FREET3 Anemia work up No results for input(s): VITAMINB12, FOLATE, FERRITIN, TIBC, IRON, RETICCTPCT in the last 72 hours. Urinalysis    Component Value Date/Time   COLORURINE YELLOW 05/17/2021 0130   APPEARANCEUR CLEAR 05/17/2021 0130   LABSPEC 1.010 05/17/2021 0130   PHURINE 6.0 05/17/2021 0130   GLUCOSEU NEGATIVE 05/17/2021 0130   HGBUR LARGE (A) 05/17/2021 0130   BILIRUBINUR NEGATIVE 05/17/2021 0130   KETONESUR NEGATIVE 05/17/2021 0130   PROTEINUR NEGATIVE 05/17/2021 0130   NITRITE NEGATIVE 05/17/2021 0130   LEUKOCYTESUR NEGATIVE 05/17/2021 0130   Sepsis Labs Invalid input(s): PROCALCITONIN,  WBC,  LACTICIDVEN Microbiology Recent Results (from the past 240 hour(s))  Aerobic/Anaerobic Culture w Gram Stain (surgical/deep wound)     Status: None   Collection Time: 05/14/21 11:00 AM   Specimen: PATH Soft tissue resection  Result Value Ref  Range Status   Specimen Description   Final    HIP synovium right hip Performed at [facility], [address] [place], [place] [address]    Special Requests   Final    NONE Performed at [facility], [address], [place], [place] [address]    Gram Stain   Final    RARE WBC PRESENT, PREDOMINANTLY MONONUCLEAR NO ORGANISMS SEEN    Culture   Final    No growth aerobically or anaerobically. Performed at [facility], [address], [place], [place] [address]    Report Status 05/19/2021 FINAL  Final  Aerobic/Anaerobic Culture w Gram Stain (surgical/deep wound)     Status: None   Collection Time: 05/14/21 11:10 AM   Specimen: PATH Bone resection; Tissue  Result Value Ref Range Status   Specimen Description   Final    BONE RIGHT FEMORAL HEAD Performed at [facility], [address] [place], [place] [address]    Special Requests   Final    NONE Performed at [facility], [address] [place], [place] [address]    Gram Stain NO WBC SEEN NO ORGANISMS SEEN   Final   Culture   Final    No growth aerobically or anaerobically. Performed at [facility], [address] [place], [place] [address]    Report Status 05/19/2021 FINAL  Final  Culture, blood (routine x 2)     Status: None   Collection Time: 05/16/21  7:31 PM   Specimen: BLOOD RIGHT HAND  Result Value Ref Range Status   Specimen Description   Final    BLOOD RIGHT HAND Performed at [facility], [address], [place], [place] [address]    Special Requests   Final    BOTTLES DRAWN AEROBIC ONLY Blood Culture results may not be optimal due to an inadequate volume of blood received in culture bottles Performed at [facility], [address], [place], [place] [address]    Culture   Final    NO GROWTH 5 DAYS Performed at [facility], [address] [place], [place] [address]    Report Status 05/21/2021 FINAL  Final  Culture, blood (routine x 2)     Status: None   Collection Time: 05/16/21  7:31 PM   Specimen: BLOOD  Result Value Ref Range Status   Specimen Description   Final    BLOOD RIGHT WRIST Performed at [facility], [address] [place], [place] [address]    Special Requests   Final    BOTTLES DRAWN AEROBIC ONLY Blood Culture results may not be optimal due to an inadequate volume of blood received in culture bottles Performed at [facility], [address], [place], [place] [address]    Culture   Final    NO GROWTH 5 DAYS Performed at [facility], [address], [place], [place] [address]    Report Status 05/21/2021 FINAL  Final  MRSA Next Gen by PCR, Nasal     Status: None   Collection Time: 05/16/21  7:41 PM   Specimen: Nasal  Mucosa; Nasal Swab  Result Value Ref Range Status   MRSA by PCR Next Gen NOT DETECTED NOT DETECTED Final    Comment: (NOTE) The GeneXpert MRSA Assay (FDA approved for NASAL specimens only), is one component of a comprehensive MRSA colonization surveillance program. It is not  intended to diagnose MRSA infection nor to guide or monitor treatment for MRSA infections. Test performance is not FDA approved in patients less than 25 years old. Performed at [facility], [address], [place], [place] [address]   Resp Panel by RT-PCR (Flu A&B, Covid) Nasopharyngeal Swab     Status: None   Collection Time: 05/17/21  6:39 PM   Specimen: Nasopharyngeal Swab; Nasopharyngeal(NP) swabs in vial transport medium  Result Value Ref Range Status   SARS Coronavirus 2 by RT PCR NEGATIVE NEGATIVE Final    Comment: (NOTE) SARS-CoV-2 target nucleic acids are NOT DETECTED.  The SARS-CoV-2 RNA is generally detectable in upper respiratory specimens during the acute phase of infection. The lowest concentration of SARS-CoV-2 viral copies this assay can detect is 138 copies/mL. A negative result does not preclude SARS-Cov-2 infection and should not be used as the sole basis for treatment or other patient management decisions. A negative result may occur with  improper specimen collection/handling, submission of specimen other than nasopharyngeal swab, presence of viral mutation(s) within the areas targeted by this assay, and inadequate number of viral copies(<138 copies/mL). A negative result must be combined with clinical observations, patient history, and epidemiological information. The expected result is Negative.  Fact Sheet for Patients:  BloggerCourse.com  Fact Sheet for Healthcare Providers:  SeriousBroker.it  This test is no t yet approved or cleared by the [place] FDA and  has been authorized for detection and/or diagnosis  of SARS-CoV-2 by FDA under an Emergency Use Authorization (EUA). This EUA will remain  in effect (meaning this test can be used) for the duration of the COVID-19 declaration under Section 564(b)(1) of the Act, 21 U.S.C.section 360bbb-3(b)(1), unless the authorization is terminated  or revoked sooner.       Influenza A by PCR NEGATIVE NEGATIVE Final   Influenza B by PCR NEGATIVE NEGATIVE Final    Comment: (NOTE) The Xpert Xpress SARS-CoV-2/FLU/RSV plus assay is intended as an aid in the diagnosis of influenza from Nasopharyngeal swab specimens and should not be used as a sole basis for treatment. Nasal washings and aspirates are unacceptable for Xpert Xpress SARS-CoV-2/FLU/RSV testing.  Fact Sheet for Patients: BloggerCourse.com  Fact Sheet for Healthcare Providers: SeriousBroker.it  This test is not yet approved or cleared by the [place] FDA and has been authorized for detection and/or diagnosis of SARS-CoV-2 by FDA under an Emergency Use Authorization (EUA). This EUA will remain in effect (meaning this test can be used) for the duration of the COVID-19 declaration under Section 564(b)(1) of the Act, 21 U.S.C. section 360bbb-3(b)(1), unless the authorization is terminated or revoked.  Performed at [facility], [address] [place], [place] [address]      Time coordinating discharge: Over 30 minutes  SIGNED:   Alwyn Ren, MD  [facility] 05/23/2021, 1:51 PM

## 2021-05-23 NOTE — Plan of Care (Signed)
  Problem: Education: Goal: Knowledge of General Education information will improve Description Including pain rating scale, medication(s)/side effects and non-pharmacologic comfort measures Outcome: Progressing   Problem: Nutrition: Goal: Adequate nutrition will be maintained Outcome: Progressing   Problem: Pain Managment: Goal: General experience of comfort will improve Outcome: Progressing   

## 2021-05-23 NOTE — Progress Notes (Incomplete)
PROGRESS NOTE    Jeanette Bennett [name]  ALP:[id number] DOB: [birth year] DOA: 05/14/2021 PCP: Lorelei Pont, DO (Confirm with patient/family/NH records and if not entered, this HAS to be entered at [facility] point of entry. "No PCP" if truly none.)   Brief Narrative: (Start on day 1 of progress note - keep it brief and live) ***   Assessment & Plan:   Principal Problem:   Degenerative joint disease of right hip Active Problems:   Acute encephalopathy          Nutrition Problem: Increased nutrient needs Etiology: post-op healing     Signs/Symptoms: estimated needs    Interventions: Ensure Enlive (each supplement provides 350kcal and 20 grams of protein), Education  Estimated body mass index is 31.8 kg/m as calculated from the following:   Height as of this encounter: 5\' 11"  (1.803 m).   Weight as of this encounter: 103.4 kg.  DVT prophylaxis: (Lovenox/Heparin/SCD's/anticoagulated/None (if comfort care) Code Status: (Full/Partial - specify details) Family Communication: (Specify name, relationship & date discussed. NO "discussed with patient") Disposition Plan:  Status is: Inpatient  {Inpatient:23812}       Consultants:  ***  Procedures: (Don't include imaging studies which can be auto populated. Include things that cannot be auto populated i.e. Echo, Carotid and venous dopplers, Foley, Bipap, HD, tubes/drains, wound vac, central lines etc) ***  Antimicrobials: (specify start and planned stop date. Auto populated tables are space occupying and do not give end dates) ***    Subjective: ***  Objective: Vitals:   05/22/21 1312 05/22/21 2206 05/23/21 0417 05/23/21 0420  BP: (!) 111/59 105/60  124/69  Pulse: (!) 101 (!) 102  93  Resp: 16 14  15   Temp: 98.3 F (36.8 C) 98.2 F (36.8 C)  97.7 F (36.5 C)  TempSrc:  Oral  Oral  SpO2: 95% 97% (!) 77% 93%  Weight:      Height:        Intake/Output Summary (Last 24 hours) at 05/23/2021 1420 Last data  filed at 05/23/2021 1400 Gross per 24 hour  Intake --  Output 2200 ml  Net -2200 ml   Filed Weights   05/14/21 0808 05/14/21 1733  Weight: 103.4 kg 103.4 kg    Examination:  General exam: Appears calm and comfortable  Respiratory system: Clear to auscultation. Respiratory effort normal. Cardiovascular system: S1 & S2 heard, RRR. No JVD, murmurs, rubs, gallops or clicks. No pedal edema. Gastrointestinal system: Abdomen is nondistended, soft and nontender. No organomegaly or masses felt. Normal bowel sounds heard. Central nervous system: Alert and oriented. No focal neurological deficits. Extremities: Symmetric 5 x 5 power. Skin: No rashes, lesions or ulcers Psychiatry: Judgement and insight appear normal. Mood & affect appropriate.     Data Reviewed: I have personally reviewed following labs and imaging studies  CBC: Recent Labs  Lab 05/16/21 1931 05/17/21 0302 05/18/21 0253 05/19/21 0617  WBC 14.3* 13.5* 17.4* 12.4*  HGB 7.4* 7.1* 7.5* 7.7*  HCT 23.3* 22.3* 24.4* 24.3*  MCV 90.3 89.6 90.7 90.3  PLT 308 315 398 452*   Basic Metabolic Panel: Recent Labs  Lab 05/16/21 1931 05/17/21 0302 05/18/21 0253 05/19/21 0617  NA 132* 136 137 139  K 4.3 3.9 3.9 3.5  CL 106 106 106 106  CO2 22 23 22 24   GLUCOSE 139* 103* 84 93  BUN 21 18 11 8   CREATININE 0.97 0.83 0.71 0.61  CALCIUM 7.0* 7.1* 7.3* 7.5*  MG  --   --  1.9 2.1  PHOS  --   --   --  1.7*   GFR: Estimated Creatinine Clearance: 84.1 mL/min (by C-G formula based on SCr of 0.61 mg/dL). Liver Function Tests: Recent Labs  Lab 05/17/21 0302 05/18/21 0253 05/19/21 0617  AST 593* 294* 218*  ALT 306* 293* 272*  ALKPHOS 51 69 66  BILITOT 0.8 0.8 0.9  PROT 5.4* 6.4* 6.2*  ALBUMIN 2.3* 2.6* 2.4*   No results for input(s): LIPASE, AMYLASE in the last 168 hours. Recent Labs  Lab 05/18/21 0841  AMMONIA 12   Coagulation Profile: Recent Labs  Lab 05/18/21 1455  INR 1.1   Cardiac Enzymes: No results for  input(s): CKTOTAL, CKMB, CKMBINDEX, TROPONINI in the last 168 hours. BNP (last 3 results) No results for input(s): PROBNP in the last 8760 hours. HbA1C: No results for input(s): HGBA1C in the last 72 hours. CBG: Recent Labs  Lab 05/16/21 1742 05/16/21 1753 05/17/21 0718  GLUCAP 140* 123* 87   Lipid Profile: No results for input(s): CHOL, HDL, LDLCALC, TRIG, CHOLHDL, LDLDIRECT in the last 72 hours. Thyroid Function Tests: No results for input(s): TSH, T4TOTAL, FREET4, T3FREE, THYROIDAB in the last 72 hours. Anemia Panel: No results for input(s): VITAMINB12, FOLATE, FERRITIN, TIBC, IRON, RETICCTPCT in the last 72 hours. Sepsis Labs: Recent Labs  Lab 05/16/21 1933 05/17/21 0302  LATICACIDVEN 1.8 1.1    Recent Results (from the past 240 hour(s))  Aerobic/Anaerobic Culture w Gram Stain (surgical/deep wound)     Status: None   Collection Time: 05/14/21 11:00 AM   Specimen: PATH Soft tissue resection  Result Value Ref Range Status   Specimen Description   Final    HIP synovium right hip Performed at [facility], [address] [place], [place] [address]    Special Requests   Final    NONE Performed at [facility], [address] [place], [place] [address]    Gram Stain   Final    RARE WBC PRESENT, PREDOMINANTLY MONONUCLEAR NO ORGANISMS SEEN    Culture   Final    No growth aerobically or anaerobically. Performed at [facility], [address] [place], [place] [address]    Report Status 05/19/2021 FINAL  Final  Aerobic/Anaerobic Culture w Gram Stain (surgical/deep wound)     Status: None   Collection Time: 05/14/21 11:10 AM   Specimen: PATH Bone resection; Tissue  Result Value Ref Range Status   Specimen Description   Final    BONE RIGHT FEMORAL HEAD Performed at [facility], [address] [place], [place] [address]    Special Requests   Final    NONE Performed at [facility],  [address] [place], [place] [address]    Gram Stain NO WBC SEEN NO ORGANISMS SEEN   Final   Culture   Final    No growth aerobically or anaerobically. Performed at [facility], [address] [place], [place] [address]    Report Status 05/19/2021 FINAL  Final  Culture, blood (routine x 2)     Status: None   Collection Time: 05/16/21  7:31 PM   Specimen: BLOOD RIGHT HAND  Result Value Ref Range Status   Specimen Description   Final    BLOOD RIGHT HAND Performed at [facility], [address] [place], [place] [address]    Special Requests   Final    BOTTLES DRAWN AEROBIC ONLY Blood Culture results may not be  optimal due to an inadequate volume of blood received in culture bottles Performed at [facility], [address] [place], [place] [address]    Culture   Final    NO GROWTH 5 DAYS Performed at [facility], [address] [place], [place] [address]    Report Status 05/21/2021 FINAL  Final  Culture, blood (routine x 2)     Status: None   Collection Time: 05/16/21  7:31 PM   Specimen: BLOOD  Result Value Ref Range Status   Specimen Description   Final    BLOOD RIGHT WRIST Performed at [facility], [address] [place], [place] [address]    Special Requests   Final    BOTTLES DRAWN AEROBIC ONLY Blood Culture results may not be optimal due to an inadequate volume of blood received in culture bottles Performed at [facility], [address] [place], [place] [address]    Culture   Final    NO GROWTH 5 DAYS Performed at [facility], [address] [place], [place] [address]    Report Status 05/21/2021 FINAL  Final  MRSA Next Gen by PCR, Nasal     Status: None   Collection Time: 05/16/21  7:41 PM   Specimen: Nasal Mucosa; Nasal Swab  Result Value Ref Range Status   MRSA by PCR Next Gen NOT DETECTED NOT DETECTED Final    Comment: (NOTE) The GeneXpert MRSA Assay  (FDA approved for NASAL specimens only), is one component of a comprehensive MRSA colonization surveillance program. It is not intended to diagnose MRSA infection nor to guide or monitor treatment for MRSA infections. Test performance is not FDA approved in patients less than 278 years old. Performed at [facility], [address] [place], [place] [address]   Resp Panel by RT-PCR (Flu A&B, Covid) Nasopharyngeal Swab     Status: None   Collection Time: 05/17/21  6:39 PM   Specimen: Nasopharyngeal Swab; Nasopharyngeal(NP) swabs in vial transport medium  Result Value Ref Range Status   SARS Coronavirus 2 by RT PCR NEGATIVE NEGATIVE Final    Comment: (NOTE) SARS-CoV-2 target nucleic acids are NOT DETECTED.  The SARS-CoV-2 RNA is generally detectable in upper respiratory specimens during the acute phase of infection. The lowest concentration of SARS-CoV-2 viral copies this assay can detect is 138 copies/mL. A negative result does not preclude SARS-Cov-2 infection and should not be used as the sole basis for treatment or other patient management decisions. A negative result may occur with  improper specimen collection/handling, submission of specimen other than nasopharyngeal swab, presence of viral mutation(s) within the areas targeted by this assay, and inadequate number of viral copies(<138 copies/mL). A negative result must be combined with clinical observations, patient history, and epidemiological information. The expected result is Negative.  Fact Sheet for Patients:  BloggerCourse.comhttps://www.fda.gov/media/152166/download  Fact Sheet for Healthcare Providers:  SeriousBroker.ithttps://www.fda.gov/media/152162/download  This test is no t yet approved or cleared by the [place] FDA and  has been authorized for detection and/or diagnosis of SARS-CoV-2 by FDA under an Emergency Use Authorization (EUA). This EUA will remain  in effect (meaning this test can be used) for the duration of  the COVID-19 declaration under Section 564(b)(1) of the Act, 21 U.S.C.section 360bbb-3(b)(1), unless the authorization is terminated  or revoked sooner.       Influenza A by PCR NEGATIVE NEGATIVE Final   Influenza B by PCR NEGATIVE NEGATIVE Final  Comment: (NOTE) The Xpert Xpress SARS-CoV-2/FLU/RSV plus assay is intended as an aid in the diagnosis of influenza from Nasopharyngeal swab specimens and should not be used as a sole basis for treatment. Nasal washings and aspirates are unacceptable for Xpert Xpress SARS-CoV-2/FLU/RSV testing.  Fact Sheet for Patients: BloggerCourse.com  Fact Sheet for Healthcare Providers: SeriousBroker.it  This test is not yet approved or cleared by the [place] FDA and has been authorized for detection and/or diagnosis of SARS-CoV-2 by FDA under an Emergency Use Authorization (EUA). This EUA will remain in effect (meaning this test can be used) for the duration of the COVID-19 declaration under Section 564(b)(1) of the Act, 21 U.S.C. section 360bbb-3(b)(1), unless the authorization is terminated or revoked.  Performed at [facility], [address] [place], [place] [address]          Radiology Studies: No results found.      Scheduled Meds:  aspirin  81 mg Oral BID   bisacodyl  10 mg Oral Daily   chlorhexidine  15 mL Mouth Rinse BID   Chlorhexidine Gluconate Cloth  6 each Topical Daily   docusate sodium  100 mg Oral BID   DULoxetine  30 mg Oral Daily   feeding supplement  237 mL Oral Q24H   gabapentin  100 mg Oral BID   heparin injection (subcutaneous)  5,000 Units Subcutaneous Q8H   levETIRAcetam  500 mg Oral BID   mouth rinse  15 mL Mouth Rinse q12n4p   pantoprazole  80 mg Oral Q1200   polyethylene glycol  17 g Oral BID   potassium chloride  10 mEq Oral Daily   sorbitol  30 mL Oral Daily   thiamine  100 mg Oral Daily   Continuous Infusions:   sodium chloride       LOS: 6 days    Time spent: ***    Alwyn Ren, MD [facility] Pager [phone]  If 7PM-7AM, please contact night-coverage [email or web address] Password [email or web address] 05/23/2021, 2:20 PM

## 2021-05-23 NOTE — TOC Transition Note (Signed)
Transition of Care [facility]) - CM/SW Discharge Note   Patient Details  Name: Jeanette Bennett MRN: [id number] Date of Birth: 06-09-1950  Transition of Care [facility]) CM/SW Contact:  Ross Ludwig, LCSW Phone Number: 05/23/2021, 3:44 PM   Clinical Narrative:    CSW was informed that patient has been approved for SNF placement authorization number is [id number].  Next review is 05/26/2021.  CSW spoke to Ernestene Mention, [phone] and she said patient can arrive today.  Patient to be d/c'ed today to [facility] [address] [place], [place], [address].  Patient and family agreeable to plans will transport via ems RN to call report to [phone] [address].  CSW left message on Patient's husband's voice mail.    Final next level of care: Skilled Nursing Facility Barriers to Discharge: Barriers Resolved   Patient Goals and CMS Choice Patient states their goals for this hospitalization and ongoing recovery are:: To go to SNF then return home with home health. CMS Medicare.gov Compare Post Acute Care list provided to:: Patient Choice offered to / list presented to : Patient  Discharge Placement              Patient [facility] [place] Patient to be transferred to facility by: [facility] Name of family member notified: Jeanette Bennett patient's husband, voice mail left at [phone] Patient and family notified of of transfer: 05/23/21  Discharge Plan and Services   Discharge Planning Services: CM Consult Post Acute Care Choice: [place]          DME Arranged: Gilford Rile rolling DME Agency: [employer] Date DME Agency Contacted: 05/18/21 Time DME Agency Contacted: [phone] Representative spoke with at DME Agency: [place] Interventions     Readmission Risk Interventions No flowsheet data found.

## 2021-05-23 NOTE — Plan of Care (Signed)
  Problem: Education: Goal: Knowledge of General Education information will improve Description: Including pain rating scale, medication(s)/side effects and non-pharmacologic comfort measures Outcome: Progressing   Problem: Clinical Measurements: Goal: Respiratory complications will improve Outcome: Progressing   Problem: Nutrition: Goal: Adequate nutrition will be maintained Outcome: Progressing   Problem: Pain Managment: Goal: General experience of comfort will improve Outcome: Progressing   Problem: Safety: Goal: Ability to remain free from injury will improve Outcome: Progressing   

## 2021-05-25 LAB — VITAMIN B1: Vitamin B1 (Thiamine): 118.1 nmol/L (ref 66.5–200.0)

## 2021-08-16 ENCOUNTER — Other Ambulatory Visit: Payer: Self-pay | Admitting: Orthopedic Surgery

## 2021-08-16 DIAGNOSIS — M5416 Radiculopathy, lumbar region: Secondary | ICD-10-CM

## 2021-08-28 ENCOUNTER — Ambulatory Visit
Admission: RE | Admit: 2021-08-28 | Discharge: 2021-08-28 | Disposition: A | Discharge status: Home / Self Care | Payer: Medicare PPO | Source: Ambulatory Visit | Attending: Orthopedic Surgery | Admitting: Orthopedic Surgery

## 2021-08-28 DIAGNOSIS — M5416 Radiculopathy, lumbar region: Secondary | ICD-10-CM

## 2021-09-16 ENCOUNTER — Other Ambulatory Visit: Payer: Self-pay | Admitting: Neurosurgery

## 2021-09-23 ENCOUNTER — Encounter (HOSPITAL_COMMUNITY): Payer: Self-pay | Admitting: Vascular Surgery

## 2021-09-23 NOTE — Progress Notes (Signed)
Surgical Instructions ? ? ? Your procedure is scheduled on Monday, May 8th, 2023. ? ? Report to [facility] Main Entrance "A" at 12:20 A.M., then check in with the Admitting office. ? Call this number if you have problems the morning of surgery: ? [phone] ? ? If you have any questions prior to your surgery date call [phone]: Open Monday-Friday 8am-4pm ? ? ? Remember: ? Do not eat or drink after midnight the night before your surgery ?  ? Take these medicines the morning of surgery with A SIP OF WATER:  ? ?amLODipine (NORVASC)  ?atorvastatin (LIPITOR)  ?DULoxetine (CYMBALTA) ?esomeprazole (NEXIUM)  ?gabapentin (NEURONTIN) ?loratadine (CLARITIN  ?Nebivolol  ? ?If needed: ? ?acetaminophen (TYLENOL)  ?colchicine ? ?As of today, STOP taking any Aspirin (unless otherwise instructed by your surgeon) Aleve, Naproxen, Ibuprofen, Motrin, Advil, Goody's, BC's, all herbal medications, fish oil, and all vitamins. ? ? ? The day of surgery: ?         ?Do not wear jewelry or makeup ?Do not wear lotions, powders, perfumes, or deodorant. ?Do not shave 48 hours prior to surgery.   ?Do not bring valuables to the hospital. ?Do not wear nail polish, gel polish, artificial nails, or any other type of covering on natural nails (fingers and toes) ?If you have artificial nails or gel coating that need to be removed by a nail salon, please have this removed prior to surgery. Artificial nails or gel coating may interfere with anesthesia's ability to adequately monitor your vital signs. ? ?[facility] is not responsible for any belongings or valuables. .  ? ?Do NOT Smoke (Tobacco/Vaping)  24 hours prior to your procedure ? ?If you use a CPAP at night, you may bring your mask for your overnight stay. ?  ?Contacts, glasses, hearing aids, dentures or partials may not be worn into surgery, please bring cases for these belongings ?  ?For patients admitted to the hospital, discharge time will be determined by your treatment team. ?   ?Patients discharged the day of surgery will not be allowed to drive home, and someone needs to stay with them for 24 hours. ? ? ?SURGICAL WAITING ROOM VISITATION ?Patients having surgery or a procedure in a hospital may have two support people. ?Children under the age of 54 must have an adult with them who is not the patient. ?They may stay in the waiting area during the procedure and may switch out with other visitors. If the patient needs to stay at the hospital during part of their recovery, the visitor guidelines for inpatient rooms apply. ? ?Please refer to the [facility] website for the visitor guidelines for Inpatients (after your surgery is over and you are in a regular room).  ? ? ?Special instructions:   ? ?Oral Hygiene is also important to reduce your risk of infection.  Remember - BRUSH YOUR TEETH THE MORNING OF SURGERY WITH YOUR REGULAR TOOTHPASTE ? ? ?[facility]- Preparing For Surgery ? ?Before surgery, you can play an important role. Because skin is not sterile, your skin needs to be as free of germs as possible. You can reduce the number of germs on your skin by washing with CHG (chlorahexidine gluconate) Soap before surgery.  CHG is an antiseptic cleaner which kills germs and bonds with the skin to continue killing germs even after washing.   ? ? ?Please do not use if you have an allergy to CHG or antibacterial soaps. If your skin becomes reddened/irritated stop using the CHG.  ?  Do not shave (including legs and underarms) for at least 48 hours prior to first CHG shower. It is OK to shave your face. ? ?Please follow these instructions carefully. ?  ? ? Shower the NIGHT BEFORE SURGERY and the MORNING OF SURGERY with CHG Soap.  ? If you chose to wash your hair, wash your hair first as usual with your normal shampoo. After you shampoo, rinse your hair and body thoroughly to remove the shampoo.  Then [employer] and genitals (private parts) with your normal soap and rinse thoroughly to remove  soap. ? ?After that Use CHG Soap as you would any other liquid soap. You can apply CHG directly to the skin and wash gently with a scrungie or a clean washcloth.  ? ?Apply the CHG Soap to your body ONLY FROM THE NECK DOWN.  Do not use on open wounds or open sores. Avoid contact with your eyes, ears, mouth and genitals (private parts). Wash Face and genitals (private parts)  with your normal soap.  ? ?Wash thoroughly, paying special attention to the area where your surgery will be performed. ? ?Thoroughly rinse your body with warm water from the neck down. ? ?DO NOT shower/wash with your normal soap after using and rinsing off the CHG Soap. ? ?Pat yourself dry with a CLEAN TOWEL. ? ?Wear CLEAN PAJAMAS to bed the night before surgery ? ?Place CLEAN SHEETS on your bed the night before your surgery ? ?DO NOT SLEEP WITH PETS. ? ? ?Day of Surgery: ? ?Take a shower with CHG soap. ?Wear Clean/Comfortable clothing the morning of surgery ?Do not apply any deodorants/lotions.   ?Remember to brush your teeth WITH YOUR REGULAR TOOTHPASTE. ? ? ? ?If you received a COVID test during your pre-op visit, it is requested that you wear a mask when out in public, stay away from anyone that may not be feeling well, and notify your surgeon if you develop symptoms. If you have been in contact with anyone that has tested positive in the last 10 days, please notify your surgeon. ? ?  ?Please read over the following fact sheets that you were given.   ?

## 2021-09-24 ENCOUNTER — Other Ambulatory Visit: Payer: Self-pay

## 2021-09-24 ENCOUNTER — Encounter (HOSPITAL_COMMUNITY): Payer: Self-pay

## 2021-09-24 ENCOUNTER — Encounter (HOSPITAL_COMMUNITY)
Admission: RE | Admit: 2021-09-24 | Discharge: 2021-09-24 | Disposition: A | Discharge status: Home / Self Care | Payer: Medicare PPO | Source: Ambulatory Visit | Attending: Neurosurgery | Admitting: Neurosurgery

## 2021-09-24 VITALS — BP 91/65 | HR 110 | Temp 97.5°F | Resp 18 | Ht 71.0 in | Wt 228.0 lb

## 2021-09-24 DIAGNOSIS — A159 Respiratory tuberculosis unspecified: Secondary | ICD-10-CM | POA: Diagnosis not present

## 2021-09-24 DIAGNOSIS — Z6831 Body mass index (BMI) 31.0-31.9, adult: Secondary | ICD-10-CM | POA: Insufficient documentation

## 2021-09-24 DIAGNOSIS — M47819 Spondylosis without myelopathy or radiculopathy, site unspecified: Secondary | ICD-10-CM | POA: Diagnosis not present

## 2021-09-24 DIAGNOSIS — E669 Obesity, unspecified: Secondary | ICD-10-CM | POA: Insufficient documentation

## 2021-09-24 DIAGNOSIS — K219 Gastro-esophageal reflux disease without esophagitis: Secondary | ICD-10-CM | POA: Diagnosis not present

## 2021-09-24 DIAGNOSIS — Z01818 Encounter for other preprocedural examination: Secondary | ICD-10-CM

## 2021-09-24 DIAGNOSIS — M4800 Spinal stenosis, site unspecified: Secondary | ICD-10-CM | POA: Diagnosis not present

## 2021-09-24 DIAGNOSIS — D649 Anemia, unspecified: Secondary | ICD-10-CM | POA: Insufficient documentation

## 2021-09-24 DIAGNOSIS — Z87891 Personal history of nicotine dependence: Secondary | ICD-10-CM | POA: Insufficient documentation

## 2021-09-24 DIAGNOSIS — I10 Essential (primary) hypertension: Secondary | ICD-10-CM | POA: Insufficient documentation

## 2021-09-24 HISTORY — DX: Other specified postprocedural states: Z98.890

## 2021-09-24 HISTORY — DX: Other specified postprocedural states: R11.2

## 2021-09-24 LAB — SURGICAL PCR SCREEN
MRSA, PCR: NEGATIVE
Staphylococcus aureus: POSITIVE — AB

## 2021-09-24 LAB — COMPREHENSIVE METABOLIC PANEL
ALT: 10 U/L (ref 0–44)
AST: 18 U/L (ref 15–41)
Albumin: 3.6 g/dL (ref 3.5–5.0)
Alkaline Phosphatase: 50 U/L (ref 38–126)
Anion gap: 9 (ref 5–15)
BUN: 21 mg/dL (ref 8–23)
CO2: 27 mmol/L (ref 22–32)
Calcium: 8.8 mg/dL — ABNORMAL LOW (ref 8.9–10.3)
Chloride: 103 mmol/L (ref 98–111)
Creatinine, Ser: 1.54 mg/dL — ABNORMAL HIGH (ref 0.44–1.00)
GFR, Estimated: 35 mL/min — ABNORMAL LOW (ref >60)
Glucose, Bld: 80 mg/dL (ref 70–99)
Potassium: 3.4 mmol/L — ABNORMAL LOW (ref 3.5–5.1)
Sodium: 139 mmol/L (ref 135–145)
Total Bilirubin: 0.3 mg/dL (ref 0.3–1.2)
Total Protein: 7.2 g/dL (ref 6.5–8.1)

## 2021-09-24 LAB — CBC
HCT: 36.4 % (ref 36.0–46.0)
Hemoglobin: 11.3 g/dL — ABNORMAL LOW (ref 12.0–15.0)
MCH: 25.4 pg — ABNORMAL LOW (ref 26.0–34.0)
MCHC: 31 g/dL (ref 30.0–36.0)
MCV: 81.8 fL (ref 80.0–100.0)
Platelets: 460 10*3/uL — ABNORMAL HIGH (ref 150–400)
RBC: 4.45 MIL/uL (ref 3.87–5.11)
RDW: 17.2 % — ABNORMAL HIGH (ref 11.5–15.5)
WBC: 9.1 10*3/uL (ref 4.0–10.5)
nRBC: 0 % (ref 0.0–0.2)

## 2021-09-24 NOTE — Progress Notes (Signed)
Anesthesia PAT Evaluation: ? Case: [id number] Date/Time: 09/27/21 1405  ? Procedure: Laminectomy and Foraminotomy - bilateral - L4-L5 with Right  microdiscectomy (Bilateral: Back) - 3C  ? Anesthesia type: General  ? Pre-op diagnosis: Stenosis  ? [facility]: [facility] ROOM 19 / [facility]  ? Surgeons: Julio Sicks, MD  ? ?  ? ? ?DISCUSSION: Patient is a 71 year old female scheduled for the above procedure. ? 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 ?  [facility] admission 05/14/21-05/23/21 following right THA. Postoperative course was complicated by metabolic encephalopathy.  On evening of POD #2 she was found to be minimally responsive with O2 sats in th 50's. No significant improvement after Narcan. There was possible seizure activity with some blood in mouth (tongue biting?). She was placed on O2 (10L 89%) and transferred to ICU.  She did not require intubation. PCCM Dr. Inis Sizer suspected acute hypoxemic respiratory failure due to narcotics, possibly undiagnosed OSA, and with acute encephalopathy. D-dimer 1.52 (H), troponin I HS 22-28. CTA of the head and neck showed no large vessel occlusion or acute findings. Neurology consulted. 05/17/21 EEG most consistent with diffuse encephalopathy without definitive seizure activity. 05/20/21 brain MRI also negative for infarct. Neurology discontinued Keppra which had been started. Abdominal US done for transaminitis and was unremarkable O2 weaned to RA by 05/20/21. Of note, 05/16/21 EKG was interpreted as aflutter with 3:1 A-V conduction, anterior ST/T wave abnormality, although other documentation noted of atrial arrhythmia--Dr. Delton Coombes commented, "12/25 ECG > irregular baseline but looks sinus, no ST changes." Discharged SN[facility] 05/23/21.  ? ?I evaluated patient during her PAT visit. Provider wore a face mask. Patient was  in a motorized wheelchair. She was well-appearing. She reported a two week stay in SNF rehab before being discharged home. She lived with her husband. About three weeks after returning home, she noticed her legs became weak. Dr. Madelon Lips treated her with a prednisone taper but ultimately she had a MRI (see below) and referral to neurosurgery. She says that she does not remember much about her post-operative course following THA, but while in Rehab she did have a few episodes of presyncope that caused her to miss a few PT sessions. She says her BP tends to run low even before THA, but the dizziness/presyncope was new.  She has only had twice since leaving rehab, last time was last week. First she said symptoms occurred after walking down the hallway, but later said symptoms started after she sat up and then she will feel hot, slightly nauseated and then have to sit or lay back to recover. She will feel back to baseline after about 10 minutes. She does not notice chest pain, SOB or palpations with these episodes. She has not had any LE edema since being discharged from rehab.  She has not seen her PCP Lorelei Pont, DO since her THA. She had labs once or twice while in rehab, but she does not know the results. ? ?EKG repeated at PAT, since 05/16/21 tracing showed possible aflutter. EKG showed ST at 106, possible inferior infarct, probable non-specific T wave abnormality. HR 110, BP 91/65 with VS.  ? ?CMET was done with PAT labs since 05/19/21 labs showed elevated LFTs > 200.  AST/ALT have now normalized. Her Creatinine, however, is newly elevated at 1.54, up from 0.61 on 05/19/21.  ? ?Reviewed with anesthesiologist Val Eagle, MD. If  case can be safely postponed then would recommend preoperative medical evaluation given complicated post-operatively course in December, newly elevated Creatinine, hypotension, tachycardia, and intermittent presyncope. I have let the patient know. I also spoke with Morrie SheldonAshley at Dr.  Lindalou HosePool's office. If Dr. Jordan LikesPool has concerns he can contact me or anesthesiologist, otherwise, I asked Morrie Sheldonshley to make sure their staff followed up with Mrs. Manns-Moore for update. I told her I would plan to forward her PAT labs/EKG to Lorelei PontMahoney, Mark, DO for his review, and he can determine next steps and/or referrals.   ? ? ?VS: BP 91/65   Pulse (!) 110   Temp (!) 36.4 ?C (Oral)   Resp 18   Ht 5\' 11"  (1.803 m)   Wt 103.4 kg   SpO2 97%   BMI 31.80 kg/m?  ?See DISCUSSION. Heart RRR, no murmur noted. No carotid bruits noted. Lung clear. No ankle edema.  ? ?PROVIDERS: ?Lorelei PontMahoney, Mark, DO [place]CP in [place], [place] ? ? ?LABS: Preoperative labs noted. See DISCUSSION. ?(all labs ordered are listed, but only abnormal results are displayed) ? ?Labs Reviewed  ?COMPREHENSIVE METABOLIC PANEL - Abnormal; Notable for the following components:  ?    Result Value  ? Potassium 3.4 (*)   ? Creatinine, Ser 1.54 (*)   ? Calcium 8.8 (*)   ? GFR, Estimated 35 (*)   ? All other components within normal limits  ?CBC - Abnormal; Notable for the following components:  ? Hemoglobin 11.3 (*)   ? MCH 25.4 (*)   ? RDW 17.2 (*)   ? Platelets 460 (*)   ? All other components within normal limits  ?SURGICAL PCR SCREEN  ? ? ?EEG 05/17/21: ?IMPRESSION: ?This study is suggestive of moderate diffuse encephalopathy, nonspecific etiology but could be secondary to toxic-metabolic causes. No seizures or definite epileptiform discharges were seen throughout the recording. ? ? ?IMAGES: ?MRI L-spine 08/28/21: ?IMPRESSION: ?1. Progression of severe spinal canal stenosis at L4-L5 secondary to ?large central disc extrusion and severe facet arthrosis. ?2. Unchanged severe spinal canal stenosis at L3-L4 and moderate ?spinal canal stenosis at L2-L3. ?3. Unchanged severe bilateral L5-S1 and left L4-5 neural foraminal ?stenosis. ?  ?MRI Brain 05/20/21: ?IMPRESSION: ?Evaluation is somewhat limited by motion artifact. Within this ?limitation, no acute intracranial  process. No etiology is seen for ?the patient's altered mental status. ? ?US RUQ Abd 05/17/21 (for transaminitis): ?IMPRESSION: ?Unremarkable right upper quadrant ultrasound. ? ?CTA head/neck 05/16/21: ?IMPRESSION: ?- No large vessel occlusion. ?- No flow limiting atherosclerotic stenotic disease. Mild stenosis of ?the distal M1 segment on the right. ?- No acute head CT finding. ?  ?1V PCXR 05/16/21: ?IMPRESSION: ?1. Enlarged cardiac silhouette with central vascular prominence. No ?overt pulmonary edema. ?2. Low lung volumes with bibasilar atelectasis. ?3.  Aortic Atherosclerosis (ICD10-I70.0). ?  ? ?EKG: ?EKG 09/24/21: ?Sinus tachycardia ?Possible Inferior infarct , age undetermined ?Abnormal ECG ?- She has at least non-specific T wave abnormality which is rather diffuse, although probably not significantly changed when compared to 03/11/21 EKG from Dr. Leary RocaMahoney. Isolated Q wave in III is new.  ? ?EKG 05/16/21:  ?Atrial flutter with 3:1 A-V conduction ?ST & T wave abnormality, consider anterior ischemia ?Abnormal ECG ?No old tracing to compare ?Confirmed by Kristeen MissNahser, Philip [phone]([id number]) on 05/17/2021 7:46:23 AM ? ? ?CV: ?Stress Echo 08/18/15 ([facility]): ?Summary:  ? 1. Negative stress echocardiogram.  ? 2. Negative for inducible sichemia based on echo criteria. patient's exercise capacity was reduced suggesting decondiitoning.  ? ? ?Past Medical  History:  ?Diagnosis Date  ? Anemia   ? Anxiety   ? Arthritis   ? Depression   ? GERD (gastroesophageal reflux disease)   ? Hypertension   ? PONV (postoperative nausea and vomiting)   ? Tuberculosis   ? ? ?Past Surgical History:  ?Procedure Laterality Date  ? ABDOMINAL HYSTERECTOMY    ? APPENDECTOMY    ? LAMINECTOMY    ? ROTATOR CUFF REPAIR Right   ? TONSILLECTOMY    ? TOTAL HIP ARTHROPLASTY Right 05/14/2021  ? Procedure: TOTAL HIP ARTHROPLASTY;  Surgeon: Frederico Hamman, MD;  Location: [facility]S;  Service: Orthopedics;  Laterality: Right;  ? WISDOM TOOTH EXTRACTION     ? ? ?MEDICATIONS: ? acetaminophen (TYLENOL) 650 MG CR tablet  ? alendronate (FOSAMAX) 70 MG tablet  ? amLODipine (NORVASC) 10 MG tablet  ? atorvastatin (LIPITOR) 80 MG tablet  ? bisacodyl (DULCOLAX) 5 MG EC tablet  ? calcium carb

## 2021-09-24 NOTE — Progress Notes (Signed)
PCP - Emelda Fear MD ?Cardiologist - denies ? ?PPM/ICD - denies ?Device Orders -  ?Rep Notified -  ? ?Chest x-ray - 1 view 05/16/21 ?EKG -09/24/21  ?Stress Test - 08/18/15 ?ECHO -08/18/15  ?Cardiac Cath - denies ? ?Sleep Study - na ?CPAP -  ? ?Fasting Blood Sugar - na ?Checks Blood Sugar _____ times a day ? ?Blood Thinner Instructions:na ?Aspirin Instructions:na ? ?ERAS Protcol -no ?PRE-SURGERY Ensure or G2-  ? ?COVID TEST- na ? ? ?Anesthesia review: Windell Moulding PA saw patient and reviewed pt's labs and EKG while pt was in PAT.  ? ?Patient denies shortness of breath, fever, cough and chest pain at PAT appointment ? ? ?All instructions explained to the patient, with a verbal understanding of the material. Patient agrees to go over the instructions while at home for a better understanding. Patient also instructed to wear a mask when out in public prior to surgery.  The opportunity to ask questions was provided. ?  ?

## 2021-09-27 ENCOUNTER — Ambulatory Visit (HOSPITAL_COMMUNITY): Admission: RE | Admit: 2021-09-27 | Payer: Medicare PPO | Source: Home / Self Care | Admitting: Neurosurgery

## 2021-09-27 SURGERY — LUMBAR LAMINECTOMY/DECOMPRESSION MICRODISCECTOMY 1 LEVEL
Anesthesia: General | Site: Back | Laterality: Bilateral

## 2021-10-15 ENCOUNTER — Telehealth (HOSPITAL_COMMUNITY): Payer: Self-pay | Admitting: Vascular Surgery

## 2021-10-15 NOTE — Progress Notes (Signed)
Anesthesia APP Follow-up: I received a return call from Dr. Jerrol Banana nurse Domingo Sep. She reported that patent had follow-up with Dr. Chauncey Reading on 10/06/21. BP 112/72, HR 70, O2 sat 96%, Creatinine improved to 1.01. Apparently, patient felt she was doing some better from a back standpoint and was hoping she may be able to avoid surgery; however, he did sign a medical clearance note should she need to proceed.   Since I did not receive a call back until after her office visit, I was unable to relay to Dr. Chauncey Reading that the anesthesiologists had inquired about potentially getting a preoperative echocardiogram given her complicated post-operative course after right THA. Her post-operative EKG was also questionable for aflutter or ST and she was having pre-syncope type spells at home (although BP was running lower then). I did discuss this with Sherrika when she called though, and she indicated she would follow-up with Dr. Chauncey Reading should anesthesiologist want the echocardiogram or other testing. I followed up with anesthesiologist Dr. Ermalene Postin who felt to was reasonable to get a preoperative TTE given her history and enlarged cardiac silhouette on CXR as it may be helpful in her management should she decide to proceed with surgery. I left a voice message for Sherrika at Dr. Jerrol Banana office regarding this and also updated Lorriane Shire at Dr. Marchelle Folks office. At this time surgery is not scheduled, and it's unclear if it will be rescheduled in the future.    Myra Gianotti, PA-C Surgical Short Stay/Anesthesiology [facility] Phone [phone] [facility] Phone [phone] 10/15/2021 4:49 PM

## 2022-02-28 ENCOUNTER — Other Ambulatory Visit: Payer: Self-pay | Admitting: Neurosurgery

## 2022-03-15 NOTE — Pre-Procedure Instructions (Signed)
Surgical Instructions    Your procedure is scheduled on Friday November 3.  Report to [facility] Main Entrance "A" at 6:00 A.M., then check in with the Admitting office.  Call this number if you have problems the morning of surgery:  [phone]  If you have any questions prior to your surgery date call [phone]: Open Monday-Friday 8am-4pm  If you experience any cold, Covid, or flu symptoms such as cough, fever, chills, shortness of breath, etc. between now and your scheduled surgery, please notify us at the above number     Remember:  Do not eat or drink after midnight the night before your surgery    Take these medications the morning of surgery with A SIP OF WATER:  amLODipine (NORVASC) atorvastatin (LIPITOR) DULoxetine (CYMBALTA) esomeprazole (NEXIUM) gabapentin (NEURONTIN) loratadine (CLARITIN) Nebivolol HCl  You may take these medications with A SIP OF WATER IF YOU NEED THEM: acetaminophen (TYLENOL) calcium carbonate (TUMS - DOSED IN MG ELEMENTAL CALCIUM) colchicine 0.6 MG   As of today, STOP taking any Aspirin (unless otherwise instructed by your surgeon) Aleve, Naproxen, Ibuprofen, Motrin, Advil, Goody's, BC's, all herbal medications, fish oil, and all vitamins. THIS INCLUDES diclofenac Sodium (VOLTAREN) and meloxicam (MOBIC)          Do NOT Smoke (Tobacco/Vaping)  24 hours prior to your procedure  If you use a CPAP at night, you may bring your mask for your overnight stay.   Contacts, glasses, hearing aids, dentures or partials may not be worn into surgery, please bring cases for these belongings   For patients admitted to the hospital, discharge time will be determined by your treatment team.   Patients discharged the day of surgery will not be allowed to drive home, and someone needs to stay with them for 24 hours.   SURGICAL WAITING ROOM VISITATION Patients having surgery or a procedure may have no more than 2 support people in the waiting area - these  visitors may rotate.   Children under the age of 37 must have an adult with them who is not the patient. If the patient needs to stay at the hospital during part of their recovery, the visitor guidelines for inpatient rooms apply. Pre-op nurse will coordinate an appropriate time for 1 support person to accompany patient in pre-op.  This support person may not rotate.   Please refer to the [facility] website for the visitor guidelines for Inpatients (after your surgery is over and you are in a regular room).    Special instructions:    Oral Hygiene is also important to reduce your risk of infection.  Remember -  BRUSH YOUR TEETH THE MORNING OF SURGERY WITH YOUR REGULAR TOOTHPASTE   [facility]- Preparing For Surgery  Before surgery, you can play an important role. Because skin is not sterile, your skin needs to be as free of germs as possible. You can reduce the number of germs on your skin by washing with CHG (chlorahexidine gluconate) Soap before surgery.  CHG is an antiseptic cleaner which kills germs and bonds with the skin to continue killing germs even after washing.     Please do not use if you have an allergy to CHG or antibacterial soaps. If your skin becomes reddened/irritated stop using the CHG.  Do not shave (including legs and underarms) for at least 48 hours prior to first CHG shower. It is OK to shave your face.  Please follow these instructions carefully.    Shower the [employer] SURGERY and  the MORNING OF SURGERY with CHG Soap.  If you chose to wash your hair, wash your hair first as usual with your normal shampoo.  After you shampoo, rinse your hair and body thoroughly to remove the shampoo.   Then [employer] and genitals (private parts) with your normal soap and rinse thoroughly to remove soap.  After that Use CHG Soap as you would any other liquid soap.  You can apply CHG directly to the skin and wash gently with a scrungie or a clean washcloth.   Apply the CHG  Soap to your body ONLY FROM THE NECK DOWN.   Do not use on open wounds or open sores.  Avoid contact with your eyes, ears, mouth and genitals (private parts).  Wash thoroughly, paying special attention to the area where your surgery will be performed.  Thoroughly rinse your body with warm water from the neck down.  DO NOT shower/wash with your normal soap after using and rinsing off the CHG Soap.  Pat yourself dry with a CLEAN TOWEL.  Wear CLEAN PAJAMAS to bed the night before surgery  Place CLEAN SHEETS on your bed the night before your surgery  DO NOT SLEEP WITH PETS.   Day of Surgery:  Take a shower with CHG soap. Wear Clean/Comfortable clothing the morning of surgery Brush your teeth WITH YOUR REGULAR TOOTHPASTE. Do not wear jewelry or makeup. Do not wear lotions, powders, perfumes or deodorant. Do not shave 48 hours prior to surgery. Do not bring valuables to the hospital.  [facility] is not responsible for any belongings or valuables.  Do not wear nail polish, gel polish, artificial nails, or any other type of covering on natural nails (fingers and toes) If you have artificial nails or gel coating that need to be removed by a nail salon, please have this removed prior to surgery. Artificial nails or gel coating may interfere with anesthesia's ability to adequately monitor your vital signs.    If you received a COVID test during your pre-op visit, it is requested that you wear a mask when out in public, stay away from anyone that may not be feeling well, and notify your surgeon if you develop symptoms. If you have been in contact with anyone that has tested positive in the last 10 days, please notify your surgeon.    Please read over the following fact sheets that you were given.

## 2022-03-16 ENCOUNTER — Encounter (HOSPITAL_COMMUNITY)
Admission: RE | Admit: 2022-03-16 | Discharge: 2022-03-16 | Disposition: A | Discharge status: Home / Self Care | Payer: Medicare PPO | Source: Ambulatory Visit | Attending: Neurosurgery | Admitting: Neurosurgery

## 2022-03-16 ENCOUNTER — Other Ambulatory Visit: Payer: Self-pay

## 2022-03-16 ENCOUNTER — Encounter (HOSPITAL_COMMUNITY): Payer: Self-pay

## 2022-03-16 VITALS — BP 143/89 | HR 90 | Temp 98.0°F | Resp 18 | Ht 71.0 in | Wt 242.6 lb

## 2022-03-16 DIAGNOSIS — R4182 Altered mental status, unspecified: Secondary | ICD-10-CM | POA: Diagnosis not present

## 2022-03-16 DIAGNOSIS — I358 Other nonrheumatic aortic valve disorders: Secondary | ICD-10-CM | POA: Insufficient documentation

## 2022-03-16 DIAGNOSIS — I10 Essential (primary) hypertension: Secondary | ICD-10-CM | POA: Insufficient documentation

## 2022-03-16 DIAGNOSIS — I3481 Nonrheumatic mitral (valve) annulus calcification: Secondary | ICD-10-CM | POA: Diagnosis not present

## 2022-03-16 DIAGNOSIS — I700 Atherosclerosis of aorta: Secondary | ICD-10-CM | POA: Insufficient documentation

## 2022-03-16 DIAGNOSIS — M48061 Spinal stenosis, lumbar region without neurogenic claudication: Secondary | ICD-10-CM | POA: Diagnosis not present

## 2022-03-16 DIAGNOSIS — M4317 Spondylolisthesis, lumbosacral region: Secondary | ICD-10-CM | POA: Insufficient documentation

## 2022-03-16 DIAGNOSIS — Z01812 Encounter for preprocedural laboratory examination: Secondary | ICD-10-CM | POA: Insufficient documentation

## 2022-03-16 DIAGNOSIS — I517 Cardiomegaly: Secondary | ICD-10-CM | POA: Diagnosis not present

## 2022-03-16 DIAGNOSIS — Z01818 Encounter for other preprocedural examination: Secondary | ICD-10-CM

## 2022-03-16 DIAGNOSIS — K219 Gastro-esophageal reflux disease without esophagitis: Secondary | ICD-10-CM | POA: Diagnosis not present

## 2022-03-16 LAB — SURGICAL PCR SCREEN
MRSA, PCR: NEGATIVE
Staphylococcus aureus: POSITIVE — AB

## 2022-03-16 LAB — CBC
HCT: 38.3 % (ref 36.0–46.0)
Hemoglobin: 12.5 g/dL (ref 12.0–15.0)
MCH: 27.8 pg (ref 26.0–34.0)
MCHC: 32.6 g/dL (ref 30.0–36.0)
MCV: 85.1 fL (ref 80.0–100.0)
Platelets: 352 10*3/uL (ref 150–400)
RBC: 4.5 MIL/uL (ref 3.87–5.11)
RDW: 15.3 % (ref 11.5–15.5)
WBC: 6 10*3/uL (ref 4.0–10.5)
nRBC: 0 % (ref 0.0–0.2)

## 2022-03-16 LAB — BASIC METABOLIC PANEL
Anion gap: 7 (ref 5–15)
BUN: 15 mg/dL (ref 8–23)
CO2: 29 mmol/L (ref 22–32)
Calcium: 8.9 mg/dL (ref 8.9–10.3)
Chloride: 102 mmol/L (ref 98–111)
Creatinine, Ser: 1.42 mg/dL — ABNORMAL HIGH (ref 0.44–1.00)
GFR, Estimated: 39 mL/min — ABNORMAL LOW (ref >60)
Glucose, Bld: 115 mg/dL — ABNORMAL HIGH (ref 70–99)
Potassium: 3.9 mmol/L (ref 3.5–5.1)
Sodium: 138 mmol/L (ref 135–145)

## 2022-03-16 LAB — TYPE AND SCREEN
ABO/RH(D): A POS
Antibody Screen: NEGATIVE

## 2022-03-16 NOTE — Anesthesia Preprocedure Evaluation (Addendum)
Anesthesia Evaluation  Patient identified by MRN, date of birth, ID band Patient awake    Reviewed: Allergy & Precautions, H&P , NPO status , Patient's Chart, lab work & pertinent test results  History of Anesthesia Complications (+) PONV and history of anesthetic complications  Airway Mallampati: II  TM Distance: >3 FB Neck ROM: Full    Dental no notable dental hx.    Pulmonary neg pulmonary ROS, Patient abstained from smoking., former smoker   Pulmonary exam normal breath sounds clear to auscultation       Cardiovascular hypertension, Pt. on medications + dysrhythmias Atrial Fibrillation  Rhythm:Regular Rate:Normal     Neuro/Psych   Anxiety     negative neurological ROS  negative psych ROS   GI/Hepatic Neg liver ROS,GERD  ,,  Endo/Other  negative endocrine ROS    Renal/GU negative Renal ROS  negative genitourinary   Musculoskeletal negative musculoskeletal ROS (+)    Abdominal   Peds negative pediatric ROS (+)  Hematology negative hematology ROS (+)   Anesthesia Other Findings   Reproductive/Obstetrics negative OB ROS                             Anesthesia Physical Anesthesia Plan  ASA: 3  Anesthesia Plan: General   Post-op Pain Management: Ofirmev IV (intra-op)*   Induction: Intravenous  PONV Risk Score and Plan: 4 or greater and Ondansetron, Dexamethasone and Treatment may vary due to age or medical condition  Airway Management Planned: Oral ETT  Additional Equipment:   Intra-op Plan:   Post-operative Plan: Extubation in OR  Informed Consent: I have reviewed the patients History and Physical, chart, labs and discussed the procedure including the risks, benefits and alternatives for the proposed anesthesia with the patient or authorized representative who has indicated his/her understanding and acceptance.     Dental advisory given  Plan Discussed with: CRNA and  Surgeon  Anesthesia Plan Comments: (PAT note written 03/16/2022 by Myra Gianotti, PA-C. )        Anesthesia Quick Evaluation

## 2022-03-16 NOTE — Progress Notes (Addendum)
PCP - Emelda Fear DO Cardiologist - none  PPM/ICD - denies Device Orders -  Rep Notified -   Chest x-ray - 05/16/21-port.1 view EKG - 09/24/21 Stress Test -  ECHO - 08/18/15,11/29/21 Cardiac Cath - none  Sleep Study -none  CPAP -   Fasting Blood Sugar - na Checks Blood Sugar _____ times a day  Last dose of GLP1 agonist-  na GLP1 instructions:   Blood Thinner Instructions:na Aspirin Instructions:na  ERAS Protcol -no PRE-SURGERY Ensure or G2-   COVID TEST- na   Anesthesia review: yes-previous post op ciomplications  Patient denies shortness of breath, fever, cough and chest pain at PAT appointment   All instructions explained to the patient, with a verbal understanding of the material. Patient agrees to go over the instructions while at home for a better understanding. Patient also instructed to wear a mask when out in public prior to surgery. The opportunity to ask questions was provided.

## 2022-03-16 NOTE — Progress Notes (Signed)
Anesthesia Chart Review:  Case: [id number] Date/Time: 03/25/22 0745   Procedure: PLIF - L4-L5 - L5-S1 (Back)   Anesthesia type: General   Pre-op diagnosis: Spondylolisthesis   [facility] ROOM [facility] / [facility]   Surgeons: Julio Sicks, MD       DISCUSSION: Patient is a 71 year old female scheduled for the above procedure.  Surgery was initially scheduled for 09/27/21, but delayed until she could be further evaluated by primary care given complicated post-operative course following 05/14/21 right THA including question of brief aflutter and ongoing symptoms of hypotension and tachycardia with intermittent presyncope (see my 09/24/21 PAT note for additional details). Since then she has been re-evaluated by Lorelei Pont, DO and undergone an echocardiogram showing normal biventricular function and no major valvular abnormalities. As of 10/25/21, Dr. Leary Roca classified as as "Low Risk" for planned procedure.   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   [place] [facility] admission 05/14/21-05/23/21 following right THA. Postoperative course was complicated by metabolic encephalopathy. On evening of POD #2 she was found to be minimally responsive with O2 sats in th 50's. No significant improvement after Narcan. There was possible seizure activity with some blood in mouth (tongue biting?). She was placed on O2 (10L 89%) and transferred to ICU.  She did not require intubation. PCCM Dr. Inis Sizer suspected acute hypoxemic respiratory failure due to narcotics, possibly undiagnosed OSA, and with acute encephalopathy. D-dimer 1.52 (H), troponin I HS 22-28. CTA of the head and neck showed no large vessel occlusion or acute findings. Neurology consulted. 05/17/21 EEG most consistent with diffuse encephalopathy without definitive seizure activity. 05/20/21 brain MRI also  negative for infarct. Neurology discontinued Keppra which had been started. Abdominal US done for transaminitis and was unremarkable O2 weaned to RA by 05/20/21. Of note, 05/16/21 EKG was interpreted as aflutter with 3:1 A-V conduction, anterior ST/T wave abnormality, although other documentation noted atrial arrhythmia--Dr. Delton Coombes commented, "12/25 ECG > irregular baseline but looks sinus, no ST changes." Discharged [facility] 05/23/21.   Creatinine appears stable at 1.42. BP 143/89, HR 90. She denied dyspnea, and chest pain at PAT RN visit. Copy of clearance letter and recent echo on chart. Last records from Dr. Leary Roca also requested but are pending.    Anesthesia team to evaluate on the day of surgery.    VS: BP (!) 143/89   Pulse 90   Temp 36.7 C (Oral)   Resp 18   Ht 5\' 11"  (1.803 m)   Wt 110 kg   SpO2 100%   BMI 33.84 kg/m    PROVIDERS: , DO is PCP ([place], [place])   LABS: Labs reviewed: Acceptable for surgery. Cr 1.42, down from 1.54 on 09/24/21. LFTs normal 09/24/21.  (all labs ordered are listed, but only abnormal results are displayed)  Labs Reviewed  BASIC METABOLIC PANEL - Abnormal; Notable for the following components:      Result Value   Glucose, Bld 115 (*)    Creatinine, Ser 1.42 (*)    GFR, Estimated 39 (*)    All other components within normal limits  SURGICAL PCR SCREEN  CBC  TYPE AND SCREEN     IMAGES: MRI L-spine 08/28/21: IMPRESSION: 1. Progression of severe spinal canal stenosis at L4-L5 secondary to large central disc extrusion and severe facet arthrosis. 2. Unchanged severe spinal canal stenosis at L3-L4 and moderate spinal canal stenosis  at L2-L3. 3. Unchanged severe bilateral L5-S1 and left L4-5 neural foraminal stenosis.   MRI Brain 05/20/21: IMPRESSION: Evaluation is somewhat limited by motion artifact. Within this limitation, no acute intracranial process. No etiology is seen for the patient's altered mental status.   [place] RUQ  Abd 05/17/21 (for transaminitis): IMPRESSION: Unremarkable right upper quadrant ultrasound.   CTA head/neck 05/16/21: IMPRESSION: - No large vessel occlusion. - No flow limiting atherosclerotic stenotic disease. Mild stenosis of the distal M1 segment on the right. - No acute head CT finding.   1V PCXR 05/16/21: IMPRESSION: 1. Enlarged cardiac silhouette with central vascular prominence. No overt pulmonary edema. 2. Low lung volumes with bibasilar atelectasis. 3.  Aortic Atherosclerosis (ICD10-I70.0).   EKG: EKG 09/24/21: Ventricular rate 106 BPM Sinus tachycardia Possible Inferior infarct , age undetermined Abnormal ECG When compared with ECG of 16-May-2021 18:12, Sinus rhythm Confirmed by [place], Will [phone]) on 09/24/2021 8:39:51 PM  EKG 05/16/21: Ventricular rate 115 BPM Atrial flutter with 3:1 A-V conduction ST & T wave abnormality, consider anterior ischemia Abnormal ECG No old tracing to compare Confirmed by Mertie Moores [id number]) on 05/17/2021 7:46:23 AM   CV: Echo 11/29/21 ([facility], but ordered by Dr. Chauncey Reading): Conclusions: 1.  Normal biventricular dimensions and systolic function.  LVEF 60 to 65%. 2.  Mild concentric left ventricular hypertrophy with impaired diastolic filling pattern. 3.  The left atrial size is normal. 4.  Interatrial and interventricular septum intact. 5.  No major valvular abnormality.  Mild aortic valve sclerosis without stenosis.  Trace aortic regurgitation.  Mildly thickened mitral valve leaflets.  Mild mitral annular calcification.  Trace mitral regurgitation.  Trace tricuspid regurgitation.  Stress Echo 08/18/15 ([facility]): Summary:   1. Negative stress echocardiogram.   2. Negative for inducible sichemia based on echo criteria. patient's exercise capacity was reduced suggesting decondiitoning.    Past Medical History:  Diagnosis Date   Anemia    Anxiety    Arthritis    Depression    GERD (gastroesophageal reflux  disease)    Hypertension    PONV (postoperative nausea and vomiting)    Tuberculosis     Past Surgical History:  Procedure Laterality Date   ABDOMINAL HYSTERECTOMY     APPENDECTOMY     LAMINECTOMY     ROTATOR CUFF REPAIR Right    TONSILLECTOMY     TOTAL HIP ARTHROPLASTY Right 05/14/2021   Procedure: TOTAL HIP ARTHROPLASTY;  Surgeon: Earlie Server, MD;  Location: [facility]S;  Service: Orthopedics;  Laterality: Right;   WISDOM TOOTH EXTRACTION      MEDICATIONS:  acetaminophen (TYLENOL) 650 MG CR tablet   alendronate (FOSAMAX) 70 MG tablet   amLODipine (NORVASC) 10 MG tablet   atorvastatin (LIPITOR) 80 MG tablet   calcium carbonate (TUMS - DOSED IN MG ELEMENTAL CALCIUM) 500 MG chewable tablet   chlorhexidine (PERIDEX) 0.12 % solution   cholecalciferol (VITAMIN D3) 25 MCG (1000 UNIT) tablet   colchicine 0.6 MG tablet   diclofenac Sodium (VOLTAREN) 1 % GEL   DULoxetine (CYMBALTA) 30 MG capsule   esomeprazole (NEXIUM) 40 MG capsule   gabapentin (NEURONTIN) 100 MG capsule   loratadine (CLARITIN) 10 MG tablet   meloxicam (MOBIC) 15 MG tablet   Multiple Vitamin (MULTIVITAMIN WITH MINERALS) TABS tablet   Nebivolol HCl 20 MG TABS   potassium chloride (KLOR-CON M) 10 MEQ tablet   Probiotic Product (DIGESTIVE ADV DIGESTIVE/IMMUNE) CAPS   triamterene-hydrochlorothiazide (MAXZIDE) 75-50 MG tablet   valsartan (DIOVAN) 160 MG tablet  No current facility-administered medications for this encounter.    Shonna Chock, PA-C Surgical Short Stay/Anesthesiology [facility] Phone [phone] [facility] Phone [phone] 03/16/2022 4:13 PM

## 2022-03-25 ENCOUNTER — Other Ambulatory Visit: Payer: Self-pay

## 2022-03-25 ENCOUNTER — Inpatient Hospital Stay (HOSPITAL_COMMUNITY): Payer: Medicare PPO

## 2022-03-25 ENCOUNTER — Encounter (HOSPITAL_COMMUNITY): Payer: Self-pay | Admitting: Neurosurgery

## 2022-03-25 ENCOUNTER — Inpatient Hospital Stay (HOSPITAL_COMMUNITY)
Admission: RE | Admit: 2022-03-25 | Discharge: 2022-03-30 | DRG: 460 | Disposition: A | Discharge status: Home / Self Care | Payer: Medicare PPO | Attending: Neurosurgery | Admitting: Neurosurgery

## 2022-03-25 ENCOUNTER — Inpatient Hospital Stay (HOSPITAL_COMMUNITY): Payer: Medicare PPO | Admitting: Vascular Surgery

## 2022-03-25 ENCOUNTER — Encounter (HOSPITAL_COMMUNITY)
Admission: RE | Disposition: A | Discharge status: Home / Self Care | Payer: Self-pay | Source: Home / Self Care | Attending: Neurosurgery

## 2022-03-25 DIAGNOSIS — M4317 Spondylolisthesis, lumbosacral region: Secondary | ICD-10-CM | POA: Diagnosis present

## 2022-03-25 DIAGNOSIS — K219 Gastro-esophageal reflux disease without esophagitis: Secondary | ICD-10-CM | POA: Diagnosis present

## 2022-03-25 DIAGNOSIS — M5126 Other intervertebral disc displacement, lumbar region: Secondary | ICD-10-CM | POA: Diagnosis present

## 2022-03-25 DIAGNOSIS — I4891 Unspecified atrial fibrillation: Secondary | ICD-10-CM | POA: Diagnosis present

## 2022-03-25 DIAGNOSIS — M4310 Spondylolisthesis, site unspecified: Secondary | ICD-10-CM | POA: Diagnosis present

## 2022-03-25 DIAGNOSIS — Z79899 Other long term (current) drug therapy: Secondary | ICD-10-CM | POA: Diagnosis not present

## 2022-03-25 DIAGNOSIS — M48061 Spinal stenosis, lumbar region without neurogenic claudication: Secondary | ICD-10-CM | POA: Diagnosis present

## 2022-03-25 DIAGNOSIS — M4316 Spondylolisthesis, lumbar region: Secondary | ICD-10-CM | POA: Diagnosis present

## 2022-03-25 DIAGNOSIS — Z881 Allergy status to other antibiotic agents status: Secondary | ICD-10-CM | POA: Diagnosis not present

## 2022-03-25 DIAGNOSIS — M4727 Other spondylosis with radiculopathy, lumbosacral region: Secondary | ICD-10-CM | POA: Diagnosis present

## 2022-03-25 DIAGNOSIS — Z7983 Long term (current) use of bisphosphonates: Secondary | ICD-10-CM | POA: Diagnosis not present

## 2022-03-25 DIAGNOSIS — Z91018 Allergy to other foods: Secondary | ICD-10-CM | POA: Diagnosis not present

## 2022-03-25 DIAGNOSIS — F419 Anxiety disorder, unspecified: Secondary | ICD-10-CM | POA: Diagnosis present

## 2022-03-25 DIAGNOSIS — Z87891 Personal history of nicotine dependence: Secondary | ICD-10-CM

## 2022-03-25 DIAGNOSIS — Z791 Long term (current) use of non-steroidal anti-inflammatories (NSAID): Secondary | ICD-10-CM

## 2022-03-25 DIAGNOSIS — Z96641 Presence of right artificial hip joint: Secondary | ICD-10-CM | POA: Diagnosis present

## 2022-03-25 DIAGNOSIS — Z90710 Acquired absence of both cervix and uterus: Secondary | ICD-10-CM | POA: Diagnosis not present

## 2022-03-25 DIAGNOSIS — M4726 Other spondylosis with radiculopathy, lumbar region: Secondary | ICD-10-CM | POA: Diagnosis present

## 2022-03-25 DIAGNOSIS — M4807 Spinal stenosis, lumbosacral region: Secondary | ICD-10-CM | POA: Diagnosis present

## 2022-03-25 DIAGNOSIS — Z882 Allergy status to sulfonamides status: Secondary | ICD-10-CM | POA: Diagnosis not present

## 2022-03-25 DIAGNOSIS — M5127 Other intervertebral disc displacement, lumbosacral region: Secondary | ICD-10-CM | POA: Diagnosis present

## 2022-03-25 DIAGNOSIS — I10 Essential (primary) hypertension: Secondary | ICD-10-CM

## 2022-03-25 SURGERY — POSTERIOR LUMBAR FUSION 2 LEVEL
Anesthesia: General | Site: Spine Lumbar

## 2022-03-25 MED ORDER — CHLORHEXIDINE GLUCONATE 0.12 % MT SOLN
15.0000 mL | Freq: Once | OROMUCOSAL | Status: AC
  Administered 2022-03-25: 15 mL via OROMUCOSAL
  Filled 2022-03-25: qty 15

## 2022-03-25 MED ORDER — HYDROMORPHONE HCL 1 MG/ML IJ SOLN
INTRAMUSCULAR | Status: DC | PRN
  Administered 2022-03-25: .5 mg via INTRAVENOUS

## 2022-03-25 MED ORDER — OXYCODONE HCL 5 MG PO TABS
10.0000 mg | ORAL_TABLET | ORAL | Status: DC | PRN
  Administered 2022-03-25 – 2022-03-30 (×11): 10 mg via ORAL
  Filled 2022-03-25 (×12): qty 2

## 2022-03-25 MED ORDER — FENTANYL CITRATE (PF) 250 MCG/5ML IJ SOLN
INTRAMUSCULAR | Status: DC | PRN
  Administered 2022-03-25 (×3): 50 ug via INTRAVENOUS
  Administered 2022-03-25: 100 ug via INTRAVENOUS

## 2022-03-25 MED ORDER — CHLORHEXIDINE GLUCONATE CLOTH 2 % EX PADS: 6.0000 | MEDICATED_PAD | Freq: Once | CUTANEOUS | Status: DC

## 2022-03-25 MED ORDER — EPHEDRINE SULFATE-NACL 50-0.9 MG/10ML-% IV SOSY
PREFILLED_SYRINGE | INTRAVENOUS | Status: DC | PRN
  Administered 2022-03-25: 10 mg via INTRAVENOUS

## 2022-03-25 MED ORDER — POTASSIUM CHLORIDE CRYS ER 10 MEQ PO TBCR
10.0000 meq | EXTENDED_RELEASE_TABLET | Freq: Every day | ORAL | Status: DC
  Administered 2022-03-26 – 2022-03-30 (×5): 10 meq via ORAL
  Filled 2022-03-25 (×5): qty 1

## 2022-03-25 MED ORDER — LIDOCAINE 2% (20 MG/ML) 5 ML SYRINGE
INTRAMUSCULAR | Status: DC | PRN
  Administered 2022-03-25: 100 mg via INTRAVENOUS

## 2022-03-25 MED ORDER — ROCURONIUM BROMIDE 10 MG/ML (PF) SYRINGE
PREFILLED_SYRINGE | INTRAVENOUS | Status: DC | PRN
  Administered 2022-03-25 (×3): 20 mg via INTRAVENOUS
  Administered 2022-03-25: 180 mg via INTRAVENOUS
  Administered 2022-03-25: 20 mg via INTRAVENOUS

## 2022-03-25 MED ORDER — TRIAMTERENE-HCTZ 75-50 MG PO TABS
1.0000 | ORAL_TABLET | Freq: Every day | ORAL | Status: DC
  Administered 2022-03-26 – 2022-03-30 (×5): 1 via ORAL
  Filled 2022-03-25 (×6): qty 1

## 2022-03-25 MED ORDER — HYDROMORPHONE HCL 1 MG/ML IJ SOLN
INTRAMUSCULAR | Status: AC
  Filled 2022-03-25: qty 0.5

## 2022-03-25 MED ORDER — DULOXETINE HCL 30 MG PO CPEP
30.0000 mg | ORAL_CAPSULE | Freq: Two times a day (BID) | ORAL | Status: DC
  Administered 2022-03-25 – 2022-03-30 (×10): 30 mg via ORAL
  Filled 2022-03-25 (×10): qty 1

## 2022-03-25 MED ORDER — HYDROMORPHONE HCL 1 MG/ML IJ SOLN
0.2500 mg | INTRAMUSCULAR | Status: DC | PRN
  Administered 2022-03-25: 0.5 mg via INTRAVENOUS
  Administered 2022-03-25: 0.25 mg via INTRAVENOUS

## 2022-03-25 MED ORDER — OXYCODONE HCL 5 MG PO TABS: 5.0000 mg | ORAL_TABLET | Freq: Once | ORAL | Status: DC | PRN

## 2022-03-25 MED ORDER — DEXAMETHASONE SODIUM PHOSPHATE 10 MG/ML IJ SOLN
INTRAMUSCULAR | Status: DC | PRN
  Administered 2022-03-25: 10 mg via INTRAVENOUS

## 2022-03-25 MED ORDER — ALBUMIN HUMAN 5 % IV SOLN: INTRAVENOUS | Status: DC | PRN

## 2022-03-25 MED ORDER — SODIUM CHLORIDE 0.9% FLUSH
3.0000 mL | Freq: Two times a day (BID) | INTRAVENOUS | Status: DC
  Administered 2022-03-25 – 2022-03-30 (×10): 3 mL via INTRAVENOUS

## 2022-03-25 MED ORDER — PANTOPRAZOLE SODIUM 40 MG PO TBEC
40.0000 mg | DELAYED_RELEASE_TABLET | Freq: Every day | ORAL | Status: DC
  Administered 2022-03-26 – 2022-03-30 (×5): 40 mg via ORAL
  Filled 2022-03-25 (×5): qty 1

## 2022-03-25 MED ORDER — IRBESARTAN 75 MG PO TABS
37.5000 mg | ORAL_TABLET | Freq: Every day | ORAL | Status: DC
  Administered 2022-03-26 – 2022-03-30 (×5): 37.5 mg via ORAL
  Filled 2022-03-25 (×5): qty 1

## 2022-03-25 MED ORDER — BISACODYL 10 MG RE SUPP: 10.0000 mg | Freq: Every day | RECTAL | Status: DC | PRN

## 2022-03-25 MED ORDER — THROMBIN 20000 UNITS EX SOLR: CUTANEOUS | Status: DC | PRN

## 2022-03-25 MED ORDER — ACETAMINOPHEN 10 MG/ML IV SOLN
1000.0000 mg | Freq: Once | INTRAVENOUS | Status: DC | PRN
  Administered 2022-03-25: 1000 mg via INTRAVENOUS

## 2022-03-25 MED ORDER — ORAL CARE MOUTH RINSE: 15.0000 mL | Freq: Once | OROMUCOSAL | Status: AC

## 2022-03-25 MED ORDER — PHENOL 1.4 % MT LIQD: 1.0000 | OROMUCOSAL | Status: DC | PRN

## 2022-03-25 MED ORDER — HYDROMORPHONE HCL 1 MG/ML IJ SOLN
INTRAMUSCULAR | Status: AC
  Filled 2022-03-25: qty 1

## 2022-03-25 MED ORDER — LORATADINE 10 MG PO TABS
10.0000 mg | ORAL_TABLET | Freq: Every day | ORAL | Status: DC
  Administered 2022-03-26 – 2022-03-30 (×5): 10 mg via ORAL
  Filled 2022-03-25 (×5): qty 1

## 2022-03-25 MED ORDER — 0.9 % SODIUM CHLORIDE (POUR BTL) OPTIME
TOPICAL | Status: DC | PRN
  Administered 2022-03-25: 1000 mL

## 2022-03-25 MED ORDER — BUPIVACAINE HCL (PF) 0.25 % IJ SOLN
INTRAMUSCULAR | Status: AC
  Filled 2022-03-25: qty 30

## 2022-03-25 MED ORDER — CEFAZOLIN SODIUM-DEXTROSE 2-4 GM/100ML-% IV SOLN
2.0000 g | INTRAVENOUS | Status: AC
  Administered 2022-03-25: 2 g via INTRAVENOUS
  Filled 2022-03-25: qty 100

## 2022-03-25 MED ORDER — MENTHOL 3 MG MT LOZG: 1.0000 | LOZENGE | OROMUCOSAL | Status: DC | PRN

## 2022-03-25 MED ORDER — ONDANSETRON HCL 4 MG/2ML IJ SOLN
4.0000 mg | Freq: Four times a day (QID) | INTRAMUSCULAR | Status: DC | PRN
  Administered 2022-03-30: 4 mg via INTRAVENOUS
  Filled 2022-03-25: qty 2

## 2022-03-25 MED ORDER — SODIUM CHLORIDE 0.9 % IV SOLN
250.0000 mL | INTRAVENOUS | Status: DC
  Administered 2022-03-25: 250 mL via INTRAVENOUS

## 2022-03-25 MED ORDER — CHLORHEXIDINE GLUCONATE 0.12 % MT SOLN
15.0000 mL | Freq: Two times a day (BID) | OROMUCOSAL | Status: DC
  Administered 2022-03-25 – 2022-03-30 (×9): 15 mL via OROMUCOSAL
  Filled 2022-03-25 (×9): qty 15

## 2022-03-25 MED ORDER — POLYETHYLENE GLYCOL 3350 17 G PO PACK: 17.0000 g | PACK | Freq: Every day | ORAL | Status: DC | PRN

## 2022-03-25 MED ORDER — ACETAMINOPHEN 10 MG/ML IV SOLN
INTRAVENOUS | Status: AC
  Filled 2022-03-25: qty 100

## 2022-03-25 MED ORDER — ACETAMINOPHEN 650 MG RE SUPP: 650.0000 mg | RECTAL | Status: DC | PRN

## 2022-03-25 MED ORDER — THROMBIN 20000 UNITS EX SOLR
CUTANEOUS | Status: AC
  Filled 2022-03-25: qty 20000

## 2022-03-25 MED ORDER — COLCHICINE 0.6 MG PO TABS: 0.6000 mg | ORAL_TABLET | ORAL | Status: DC

## 2022-03-25 MED ORDER — NEBIVOLOL HCL 10 MG PO TABS
20.0000 mg | ORAL_TABLET | Freq: Every day | ORAL | Status: DC
  Administered 2022-03-26 – 2022-03-30 (×4): 20 mg via ORAL
  Filled 2022-03-25 (×5): qty 2

## 2022-03-25 MED ORDER — SODIUM CHLORIDE 0.9% FLUSH: 3.0000 mL | INTRAVENOUS | Status: DC | PRN

## 2022-03-25 MED ORDER — VANCOMYCIN HCL 1000 MG IV SOLR
INTRAVENOUS | Status: DC | PRN
  Administered 2022-03-25: 1000 mg via TOPICAL

## 2022-03-25 MED ORDER — VANCOMYCIN HCL 1000 MG IV SOLR
INTRAVENOUS | Status: AC
  Filled 2022-03-25: qty 20

## 2022-03-25 MED ORDER — HYDROMORPHONE HCL 1 MG/ML IJ SOLN
1.0000 mg | INTRAMUSCULAR | Status: DC | PRN
  Administered 2022-03-27 (×2): 1 mg via INTRAVENOUS
  Filled 2022-03-25 (×2): qty 1

## 2022-03-25 MED ORDER — CALCIUM CARBONATE ANTACID 500 MG PO CHEW: 1500.0000 mg | CHEWABLE_TABLET | Freq: Three times a day (TID) | ORAL | Status: DC | PRN

## 2022-03-25 MED ORDER — AMLODIPINE BESYLATE 10 MG PO TABS
10.0000 mg | ORAL_TABLET | Freq: Every day | ORAL | Status: DC
  Administered 2022-03-26 – 2022-03-30 (×4): 10 mg via ORAL
  Filled 2022-03-25 (×5): qty 1

## 2022-03-25 MED ORDER — FENTANYL CITRATE (PF) 250 MCG/5ML IJ SOLN
INTRAMUSCULAR | Status: AC
  Filled 2022-03-25: qty 5

## 2022-03-25 MED ORDER — OXYCODONE HCL 5 MG/5ML PO SOLN: 5.0000 mg | Freq: Once | ORAL | Status: DC | PRN

## 2022-03-25 MED ORDER — FLEET ENEMA 7-19 GM/118ML RE ENEM: 1.0000 | ENEMA | Freq: Once | RECTAL | Status: DC | PRN

## 2022-03-25 MED ORDER — ATORVASTATIN CALCIUM 40 MG PO TABS
40.0000 mg | ORAL_TABLET | Freq: Every day | ORAL | Status: DC
  Administered 2022-03-26 – 2022-03-30 (×5): 40 mg via ORAL
  Filled 2022-03-25 (×5): qty 1

## 2022-03-25 MED ORDER — PROPOFOL 10 MG/ML IV BOLUS
INTRAVENOUS | Status: DC | PRN
  Administered 2022-03-25: 120 mg via INTRAVENOUS
  Administered 2022-03-25: 80 mg via INTRAVENOUS

## 2022-03-25 MED ORDER — HYDROCODONE-ACETAMINOPHEN 10-325 MG PO TABS: 1.0000 | ORAL_TABLET | ORAL | Status: DC | PRN

## 2022-03-25 MED ORDER — CEFAZOLIN SODIUM-DEXTROSE 1-4 GM/50ML-% IV SOLN
1.0000 g | Freq: Three times a day (TID) | INTRAVENOUS | Status: AC
  Administered 2022-03-25 – 2022-03-26 (×2): 1 g via INTRAVENOUS
  Filled 2022-03-25 (×2): qty 50

## 2022-03-25 MED ORDER — ADULT MULTIVITAMIN W/MINERALS CH
1.0000 | ORAL_TABLET | Freq: Every day | ORAL | Status: DC
  Administered 2022-03-26 – 2022-03-30 (×5): 1 via ORAL
  Filled 2022-03-25 (×5): qty 1

## 2022-03-25 MED ORDER — BUPIVACAINE HCL (PF) 0.25 % IJ SOLN
INTRAMUSCULAR | Status: DC | PRN
  Administered 2022-03-25: 30 mL

## 2022-03-25 MED ORDER — ONDANSETRON HCL 4 MG/2ML IJ SOLN: 4.0000 mg | Freq: Once | INTRAMUSCULAR | Status: DC | PRN

## 2022-03-25 MED ORDER — ONDANSETRON HCL 4 MG/2ML IJ SOLN
INTRAMUSCULAR | Status: DC | PRN
  Administered 2022-03-25: 4 mg via INTRAVENOUS

## 2022-03-25 MED ORDER — COLCHICINE 0.6 MG PO TABS: 0.6000 mg | ORAL_TABLET | Freq: Two times a day (BID) | ORAL | Status: DC | PRN

## 2022-03-25 MED ORDER — PHENYLEPHRINE HCL-NACL 20-0.9 MG/250ML-% IV SOLN
INTRAVENOUS | Status: DC | PRN
  Administered 2022-03-25: 40 ug/min via INTRAVENOUS

## 2022-03-25 MED ORDER — ONDANSETRON HCL 4 MG PO TABS: 4.0000 mg | ORAL_TABLET | Freq: Four times a day (QID) | ORAL | Status: DC | PRN

## 2022-03-25 MED ORDER — PHENYLEPHRINE 80 MCG/ML (10ML) SYRINGE FOR IV PUSH (FOR BLOOD PRESSURE SUPPORT)
PREFILLED_SYRINGE | INTRAVENOUS | Status: DC | PRN
  Administered 2022-03-25: 80 ug via INTRAVENOUS
  Administered 2022-03-25: 160 ug via INTRAVENOUS

## 2022-03-25 MED ORDER — LACTATED RINGERS IV SOLN: INTRAVENOUS | Status: DC

## 2022-03-25 MED ORDER — ACETAMINOPHEN 325 MG PO TABS
650.0000 mg | ORAL_TABLET | ORAL | Status: DC | PRN
  Administered 2022-03-27: 650 mg via ORAL
  Filled 2022-03-25 (×2): qty 2

## 2022-03-25 MED ORDER — ROCURONIUM BROMIDE 10 MG/ML (PF) SYRINGE
PREFILLED_SYRINGE | INTRAVENOUS | Status: AC
  Filled 2022-03-25: qty 50

## 2022-03-25 MED ORDER — VITAMIN D 25 MCG (1000 UNIT) PO TABS
2000.0000 [IU] | ORAL_TABLET | Freq: Every day | ORAL | Status: DC
  Administered 2022-03-26 – 2022-03-30 (×5): 2000 [IU] via ORAL
  Filled 2022-03-25 (×5): qty 2

## 2022-03-25 MED ORDER — PROPOFOL 10 MG/ML IV BOLUS
INTRAVENOUS | Status: AC
  Filled 2022-03-25: qty 20

## 2022-03-25 MED ORDER — SUGAMMADEX SODIUM 200 MG/2ML IV SOLN
INTRAVENOUS | Status: DC | PRN
  Administered 2022-03-25: 225 mg via INTRAVENOUS

## 2022-03-25 MED ORDER — GABAPENTIN 100 MG PO CAPS
100.0000 mg | ORAL_CAPSULE | Freq: Two times a day (BID) | ORAL | Status: DC
  Administered 2022-03-25 – 2022-03-30 (×10): 100 mg via ORAL
  Filled 2022-03-25 (×10): qty 1

## 2022-03-25 MED ORDER — DIGESTIVE ADV DIGESTIVE/IMMUNE PO CAPS: 2.0000 | ORAL_CAPSULE | ORAL | Status: DC

## 2022-03-25 MED ORDER — DEXAMETHASONE SODIUM PHOSPHATE 10 MG/ML IJ SOLN
INTRAMUSCULAR | Status: AC
  Filled 2022-03-25: qty 1

## 2022-03-25 MED ORDER — DIAZEPAM 5 MG PO TABS
5.0000 mg | ORAL_TABLET | Freq: Four times a day (QID) | ORAL | Status: DC | PRN
  Administered 2022-03-26: 5 mg via ORAL
  Administered 2022-03-27 – 2022-03-30 (×4): 10 mg via ORAL
  Filled 2022-03-25 (×4): qty 2
  Filled 2022-03-25: qty 1

## 2022-03-25 SURGICAL SUPPLY — 53 items
BAG COUNTER SPONGE SURGICOUNT (BAG) ×1 IMPLANT
BENZOIN TINCTURE PRP APPL 2/3 (GAUZE/BANDAGES/DRESSINGS) ×1 IMPLANT
BLADE BONE MILL MEDIUM (MISCELLANEOUS) ×1 IMPLANT
BUR MATCHSTICK NEURO 3.0 LAGG (BURR) ×1 IMPLANT
CAGE EXP CATALYFT 9 (Plate) IMPLANT
CAGE INTERBODY PL LG 7X26.5X24 (Cage) IMPLANT
CANISTER SUCT 3000ML PPV (MISCELLANEOUS) ×1 IMPLANT
CAP LCK SPNE (Orthopedic Implant) ×6 IMPLANT
CAP LOCK SPINE RADIUS (Orthopedic Implant) IMPLANT
CAP LOCKING (Orthopedic Implant) ×6 IMPLANT
CNTNR URN SCR LID CUP LEK RST (MISCELLANEOUS) ×1 IMPLANT
CONT SPEC 4OZ STRL OR WHT (MISCELLANEOUS) ×1
COVER BACK TABLE 60X90IN (DRAPES) ×1 IMPLANT
DERMABOND ADVANCED .7 DNX12 (GAUZE/BANDAGES/DRESSINGS) ×1 IMPLANT
DRAPE C-ARM 42X72 X-RAY (DRAPES) ×2 IMPLANT
DRAPE HALF SHEET 40X57 (DRAPES) IMPLANT
DRAPE LAPAROTOMY 100X72X124 (DRAPES) ×1 IMPLANT
DRAPE SURG 17X23 STRL (DRAPES) ×4 IMPLANT
DRSG OPSITE POSTOP 4X6 (GAUZE/BANDAGES/DRESSINGS) ×1 IMPLANT
DRSG OPSITE POSTOP 4X8 (GAUZE/BANDAGES/DRESSINGS) IMPLANT
DURAPREP 26ML APPLICATOR (WOUND CARE) ×1 IMPLANT
ELECT REM PT RETURN 9FT ADLT (ELECTROSURGICAL) ×1
ELECTRODE REM PT RTRN 9FT ADLT (ELECTROSURGICAL) ×1 IMPLANT
EVACUATOR 1/8 PVC DRAIN (DRAIN) IMPLANT
GAUZE 4X4 16PLY ~~LOC~~+RFID DBL (SPONGE) IMPLANT
GAUZE SPONGE 4X4 12PLY STRL (GAUZE/BANDAGES/DRESSINGS) IMPLANT
GLOVE BIO SURGEON STRL SZ 6.5 (GLOVE) ×1 IMPLANT
GLOVE BIOGEL PI IND STRL 6.5 (GLOVE) ×1 IMPLANT
GLOVE ECLIPSE 9.0 STRL (GLOVE) ×2 IMPLANT
GOWN STRL REUS W/ TWL LRG LVL3 (GOWN DISPOSABLE) IMPLANT
GOWN STRL REUS W/ TWL XL LVL3 (GOWN DISPOSABLE) ×2 IMPLANT
GOWN STRL REUS W/TWL 2XL LVL3 (GOWN DISPOSABLE) IMPLANT
GOWN STRL REUS W/TWL LRG LVL3 (GOWN DISPOSABLE) ×2
GOWN STRL REUS W/TWL XL LVL3 (GOWN DISPOSABLE) ×3
KIT BASIN OR (CUSTOM PROCEDURE TRAY) ×1 IMPLANT
KIT TURNOVER KIT B (KITS) ×1 IMPLANT
NEEDLE HYPO 22GX1.5 SAFETY (NEEDLE) ×1 IMPLANT
NS IRRIG 1000ML POUR BTL (IV SOLUTION) ×1 IMPLANT
PACK LAMINECTOMY NEURO (CUSTOM PROCEDURE TRAY) ×1 IMPLANT
PUTTY GRAFTON DBF 6CC W/DELIVE (Putty) IMPLANT
ROD 5.5X60MM GREEN (Rod) IMPLANT
SCREW 5.75X45MM (Screw) IMPLANT
SCREW 6.75X35MM (Screw) IMPLANT
SPONGE SURGIFOAM ABS GEL 100 (HEMOSTASIS) ×1 IMPLANT
STRIP CLOSURE SKIN 1/2X4 (GAUZE/BANDAGES/DRESSINGS) ×2 IMPLANT
SUT VIC AB 0 CT1 18XCR BRD8 (SUTURE) ×2 IMPLANT
SUT VIC AB 0 CT1 8-18 (SUTURE) ×2
SUT VIC AB 2-0 CT1 18 (SUTURE) ×1 IMPLANT
SUT VIC AB 3-0 SH 8-18 (SUTURE) ×2 IMPLANT
TOWEL GREEN STERILE (TOWEL DISPOSABLE) ×1 IMPLANT
TOWEL GREEN STERILE FF (TOWEL DISPOSABLE) ×1 IMPLANT
TRAY FOLEY MTR SLVR 16FR STAT (SET/KITS/TRAYS/PACK) ×1 IMPLANT
WATER STERILE IRR 1000ML POUR (IV SOLUTION) ×1 IMPLANT

## 2022-03-25 NOTE — Anesthesia Procedure Notes (Signed)
Procedure Name: Intubation Date/Time: 03/25/2022 8:18 AM  Performed by: Anastasio Auerbach, CRNAPre-anesthesia Checklist: Patient identified, Emergency Drugs available, Suction available and Patient being monitored Patient Re-evaluated:Patient Re-evaluated prior to induction Oxygen Delivery Method: Circle system utilized Preoxygenation: Pre-oxygenation with 100% oxygen Induction Type: IV induction Ventilation: Mask ventilation without difficulty Laryngoscope Size: Mac and 3 Grade View: Grade I Tube type: Oral Number of attempts: 1 Airway Equipment and Method: Stylet and Oral airway Placement Confirmation: ETT inserted through vocal cords under direct vision, positive ETCO2 and breath sounds checked- equal and bilateral Secured at: 22 cm Tube secured with: Tape Dental Injury: Teeth and Oropharynx as per pre-operative assessment

## 2022-03-25 NOTE — Progress Notes (Signed)
Orthopedic Tech Progress Note Patient Details:  Jeanette Bennett August 29, 1950 [id number]  Ortho Devices Type of Ortho Device: Lumbar corsett Ortho Device/Splint Interventions: Ordered    Brace delivered to patients room.  Vernona Rieger 03/25/2022, 5:13 PM

## 2022-03-25 NOTE — Transfer of Care (Signed)
Immediate Anesthesia Transfer of Care Note  Patient: Jeanette Bennett [facility]  Procedure(s) Performed: LUMBAR FOUR-FIVE, LUMBAR FIVE-SACRAL ONE POSTERIOR LUMBAR INTERBODY FUSION (Spine Lumbar)  Patient Location: PACU  Anesthesia Type:General  Level of Consciousness: awake, oriented, and drowsy  Airway & Oxygen Therapy: Patient Spontanous Breathing and Patient connected to nasal cannula oxygen  Post-op Assessment: Report given to RN, Post -op Vital signs reviewed and stable, and Patient moving all extremities X 4  Post vital signs: Reviewed and stable  Last Vitals:  Vitals Value Taken Time  BP 132/69 03/25/22 1212  Temp 37.3 C 03/25/22 1212  Pulse 72 03/25/22 1215  Resp 16 03/25/22 1215  SpO2 95 % 03/25/22 1215  Vitals shown include unvalidated device data.  Last Pain:  Vitals:   03/25/22 0643  PainSc: 6          Complications: No notable events documented.

## 2022-03-25 NOTE — H&P (Signed)
Jeanette Bennett is an 71 y.o. female.   Chief Complaint: Back pain HPI: 71 year old female with progressively worsening lumbar pain with radiation into both lower extremities with associated weakness and sensory loss.  Work-up demonstrates evidence marked facet arthropathy and early degenerative spondylolisthesis at L4-5 with a large central disc herniation and severe spinal stenosis.  Patient has had prior surgery at L5-S1.  She has marked bilateral neural foraminal stenosis and significant lateral recess stenosis bilaterally at L5-S1.  Patient presents now for L4-5 and L5-S1 decompression and fusion surgery in hopes of improving her symptoms.  Past Medical History:  Diagnosis Date   Anemia    Anxiety    Arthritis    Depression    GERD (gastroesophageal reflux disease)    Hypertension    PONV (postoperative nausea and vomiting)    Tuberculosis     Past Surgical History:  Procedure Laterality Date   ABDOMINAL HYSTERECTOMY     APPENDECTOMY     LAMINECTOMY     ROTATOR CUFF REPAIR Right    TONSILLECTOMY     TOTAL HIP ARTHROPLASTY Right 05/14/2021   Procedure: TOTAL HIP ARTHROPLASTY;  Surgeon: Earlie Server, MD;  Location: [facility];  Service: Orthopedics;  Laterality: Right;   WISDOM TOOTH EXTRACTION      History reviewed. No pertinent family history. Social History:  reports that she has quit smoking. Her smoking use included cigarettes. She has never used smokeless tobacco. She reports current alcohol use. She reports current drug use. Frequency: 1.00 time per week. Drug: Marijuana.  Allergies:  Allergies  Allergen Reactions   Other Shortness Of Breath    Tree Nuts   Sulfa Antibiotics Nausea And Vomiting and Rash    Medications Prior to Admission  Medication Sig Dispense Refill   acetaminophen (TYLENOL) 650 MG CR tablet Take 1,300 mg by mouth every 8 (eight) hours as needed for pain.     alendronate (FOSAMAX) 70 MG tablet Take 70 mg by mouth every Monday. Take with a  full glass of water on an empty stomach.     amLODipine (NORVASC) 10 MG tablet Take 10 mg by mouth daily.     atorvastatin (LIPITOR) 80 MG tablet Take 40 mg by mouth daily.     calcium carbonate (TUMS - DOSED IN MG ELEMENTAL CALCIUM) 500 MG chewable tablet Chew 1,500 mg by mouth 3 (three) times daily as needed for indigestion or heartburn.     chlorhexidine (PERIDEX) 0.12 % solution Use as directed 15 mLs in the mouth or throat 2 (two) times daily.     cholecalciferol (VITAMIN D3) 25 MCG (1000 UNIT) tablet Take 2,000 Units by mouth daily.     diclofenac Sodium (VOLTAREN) 1 % GEL Apply 1 application. topically 4 (four) times daily as needed (pain).     DULoxetine (CYMBALTA) 30 MG capsule Take 30 mg by mouth 2 (two) times daily.     esomeprazole (NEXIUM) 40 MG capsule Take 40 mg by mouth daily.     gabapentin (NEURONTIN) 100 MG capsule Take 100 mg by mouth 2 (two) times daily.     loratadine (CLARITIN) 10 MG tablet Take 10 mg by mouth daily.     meloxicam (MOBIC) 15 MG tablet Take 15 mg by mouth daily.     Multiple Vitamin (MULTIVITAMIN WITH MINERALS) TABS tablet Take 1 tablet by mouth daily.     Nebivolol HCl 20 MG TABS Take 20 mg by mouth daily.     potassium chloride (KLOR-CON M) 10 MEQ tablet  Take 1 tablet (10 mEq total) by mouth daily.     Probiotic Product (DIGESTIVE ADV DIGESTIVE/IMMUNE) CAPS Take 2 capsules by mouth 2 (two) times a week.     triamterene-hydrochlorothiazide (MAXZIDE) 75-50 MG tablet Take 1 tablet by mouth daily.     valsartan (DIOVAN) 160 MG tablet Take 160 mg by mouth daily.     colchicine 0.6 MG tablet Take 0.6-1.2 mg by mouth See admin instructions. Take 1.2 mg at onset of gout flare then take 0.6 mg 1 hour later as needed for gout      No results found for this or any previous visit (from the past 32 hour(s)). No results found.  Pertinent items noted in HPI and remainder of comprehensive ROS otherwise negative.  Blood pressure 128/78, pulse 77, temperature 98.2 F  (36.8 C), resp. rate 17, height 5\' 11"  (1.803 m), weight 108.9 kg, SpO2 99 %.  Patient is awake and alert.  She is oriented and appropriate.  Speech is fluent.  Judgment insight are intact.  Cranial nerve function normal bilateral.  Motor examination reveals significant weakness of dorsiflexion bilaterally grading at 4-/5 in both her anterior tibialis and extensor houses longus muscles.  Sensory examination with decrease sensation pinprick and light touch in her L4-L5 and S1 dermatomes bilaterally.  Deep Temrex is hypoactive but symmetric.  Achilles reflexes are absent bilaterally.  No evidence of long track signs.  Gait is very limited and antalgic.  Examination head ears eyes nose and throat is unremarked.  Chest and abdomen are benign.  Extremities are free from injury or deformity. 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  L5-S1 degenerative disc disease with significant foraminal and lateral recess stenosis.  Plan bilateral L4-5 and L5-S1 decompressive laminotomies and foraminotomies followed by posterior lumbar interbody fusion utilizing interbody cages, local harvested autograft, and augmented with posterior LAD arthrodesis utilizing nonsegmental pedicle screw fixation and local autograft.  Risks and benefits been explained.  Patient wishes to proceed.  Mallie Mussel A Dorrell Mit[place]ltree 03/25/2022, 7:53 AM

## 2022-03-25 NOTE — Op Note (Signed)
Date of procedure: 03/25/2022  Date of dictation: Same  Service: Neurosurgery  Preoperative diagnosis: L4-5, L5-S1 degenerative spondylolisthesis with stenosis and radiculopathy  Postoperative diagnosis: Same  Procedure Name: Bilateral L4-5 and L5-S1 decompressive laminotomies and foraminotomies more extensive than would be required for simple interbody fusion alone.  L4-5, L5-S1 posterior lumbar interbody fusion utilizing interbody cages, locally harvested autograft, morselized allograft  L4-5 and S1 posterior lateral arthrodesis utilizing segmental pedicle screw fixation and local autograft.  Surgeon:Lamica Mccart A.Kaitlynn Tramontana, M.D.  Asst. Surgeon: Reinaldo Meeker, NP  Anesthesia: General  Indication: NP 71 year old female with severe back and bilateral extremity pain paresthesias numbness and weakness failing conservative management.  Work-up demonstrates evidence of severe stenosis at E9-5 and M8-U1 complicated by degenerative spondylolisthesis and central disc herniations at both levels.  Patient presents now for two-level lumbar decompression and fusion surgery in hopes of improving her symptoms.   Operative note:After induction of anesthesia, patient position prone onto Wilson frame and appropriate padded.  Lumbar region prepped and draped sterilely.  Incision made overlying L4-5 and S1.  Dissection performed bilaterally.  Retractors placed.  Fluoroscopy used.  Levels confirmed.  Previous laminotomy sites bilaterally at L5-S1 were dissected free and epidural scar was resected.  Laminotomy was extended to remove the inferior two thirds of the lamina of L5, the entire pars interarticularis and inferior facet of L5, the majority of the superior facet of S1, and the superior aspect of the S1 lamina.  Ligament flavum and residual epidural scar were further resected.  Foraminotomies completed the course exiting L5 and S1 nerve roots.  Bilateral discectomies then performed.  Attention is placed to the L4-5 level  where the patient underwent bilateral decompressive laminotomies facetectomies and foraminotomies in a similar fashion.  Once again discectomy at L4-5 was performed.  The spaces then prepared for interbody fusion.  With a distractor placed in patient's right side to space were cleaned of all soft tissue.  Starting first at L5-S1 a 7 mm Med[employer]onic expandable cage was then impacted in the place and expanded.  Distractor removed patient's contralateral side.  Dissipates in the right side was prepared.  Morselized autograft was packed into the interspace.  Second cage was then packed into place on the left side and expanded.  L4-5 was approached in a similar way.  The cage into L4-5 did subside slightly into the body of L4.  It was in good position otherwise.  Distractor removed patient's right side.  Disc base prepared for interbody fusion.  Morselized autograft packed in interspace.  Second cage was then impacted into place and expanded.  Pedicles at L4-5 and S1 were identified using surface landmarks and intraoperative fluoroscopy and superficial bone overlying the pedicle at L4-5 and S1 and then removed using high-speed drill.  Pedicle was then probed using a pedicle all each pedicle all track was then probed and found to be solidly within the bone.  Each screw temple was tapped.  The screw hole was probed and found to be solid within bone.  5.75 mm radius brand screws were placed bilaterally at L4 and L5.  6.7 five screws placed bilaterally at S1.  On the right side it was not pleased with the trajectory of the pedicle screw and did not feel like I had great purchase.  Passed a right-sided alar screw with good position.  Final images reveal good position of the cages and the hardware proper upper level with normal alignment of spine.  Each cage was then packed with demineralized bone fibers.  Gelfoam was placed over the laminotomy sites.  Short segment titanium rod placed to the screws at L4-5 and S1.  Locking caps  then applied and engaged.  Transverse processes and sacral ala were decorticated.  Morselized autograft was packed posterior laterally for later fusion.  Vancomycin powder was placed in the deep wound space.  Hemostasis was assured.  Wounds then closed in layers with Vicryl sutures.  Steri-Strips and sterile dressing were applied.  No apparent complications.  Patient tolerated the procedure well and she returns to the recovery room postop.

## 2022-03-25 NOTE — Brief Op Note (Signed)
03/25/2022  12:00 PM  PATIENT:  Myrissa Chipley Manns-Moore  71 y.o. female  PRE-OPERATIVE DIAGNOSIS:  Spondylolisthesis  POST-OPERATIVE DIAGNOSIS:  Spondylolisthesis  PROCEDURE:  Procedure(s): LUMBAR FOUR-FIVE, LUMBAR FIVE-SACRAL ONE POSTERIOR LUMBAR INTERBODY FUSION (N/A)  SURGEON:  Surgeon(s) and Role:    * Earnie Larsson, MD - Primary  PHYSICIAN ASSISTANT:   ASSISTANTSMearl Latin   ANESTHESIA:   general  EBL:  425 mL   BLOOD ADMINISTERED:none  DRAINS: none   LOCAL MEDICATIONS USED:  MARCAINE     SPECIMEN:  No Specimen  DISPOSITION OF SPECIMEN:  N/A  COUNTS:  YES  TOURNIQUET:  * No tourniquets in log *  DICTATION: .Dragon Dictation  PLAN OF CARE: Admit to inpatient   PATIENT DISPOSITION:  PACU - hemodynamically stable.   Delay start of Pharmacological VTE agent (>24hrs) due to surgical blood loss or risk of bleeding: yes

## 2022-03-26 NOTE — Evaluation (Signed)
Physical Therapy Evaluation Patient Details Name: Jeanette Bennett MRN: [id number] DOB: June 14, 1950 Today's Date: 03/26/2022  History of Present Illness  71 y.o. female presents to [facility] hospital on 03/25/2022 with worsening low back pain. Imaging demonstrates spondylolisthesis at L4-5 with disc herniation. Pt underwent L4-S1 decompression and fusion on 11/3. PMH includes anxiety, OA, depression, HTN.  Clinical Impression  Pt presents to PT with deficits in knowledge of back precautions, power, strength. Pt is able to tolerate much greater ambulation distances than at baseline, reporting less significant pain than pre-surgery. Pt will benefit from continued education of back precautions and log roll technique. PT recommends outpatient when the pt is cleared for more strenuous core and LE strengthening.     Recommendations for follow up therapy are one component of a multi-disciplinary discharge planning process, led by the attending physician.  Recommendations may be updated based on patient status, additional functional criteria and insurance authorization.  Follow Up Recommendations Outpatient PT      Assistance Recommended at Discharge PRN  Patient can return home with the following  A little help with walking and/or transfers;A little help with bathing/dressing/bathroom;Assistance with cooking/housework;Assist for transportation;Help with stairs or ramp for entrance    Equipment Recommendations None recommended by PT  Recommendations for Other Services       Functional Status Assessment Patient has had a recent decline in their functional status and demonstrates the ability to make significant improvements in function in a reasonable and predictable amount of time.     Precautions / Restrictions Precautions Precautions: Fall;Back Precaution Booklet Issued: Yes (comment) Required Braces or Orthoses: Spinal Brace Spinal Brace: Lumbar corset;Applied in sitting  position Restrictions Weight Bearing Restrictions: No      Mobility  Bed Mobility Overal bed mobility: Needs Assistance Bed Mobility: Sidelying to Sit, Rolling, Sit to Sidelying Rolling: Min guard Sidelying to sit: Min guard     Sit to sidelying: Min assist General bed mobility comments: pt initially impulsively rises into long sitting in bed, PT provides instruction to avoid long sitting and to instead log roll and then rise from sidelying    Transfers Overall transfer level: Needs assistance Equipment used: Rolling walker (2 wheels) Transfers: Sit to/from Stand Sit to Stand: Min guard                Ambulation/Gait Ambulation/Gait assistance: Supervision Gait Distance (Feet): 150 Feet Assistive device: Rolling walker (2 wheels) Gait Pattern/deviations: Step-through pattern Gait velocity: reduced Gait velocity interpretation: <1.8 ft/sec, indicate of risk for recurrent falls   General Gait Details: steady step-through gait  Stairs            Wheelchair Mobility    Modified Rankin (Stroke Patients Only)       Balance Overall balance assessment: Needs assistance Sitting-balance support: No upper extremity supported, Feet supported Sitting balance-Leahy Scale: Good     Standing balance support: Single extremity supported, Reliant on assistive device for balance Standing balance-Leahy Scale: Poor                               Pertinent Vitals/Pain Pain Assessment Pain Assessment: 0-10 Pain Score: 8  Pain Location: back Pain Descriptors / Indicators: Sore Pain Intervention(s): Monitored during session    Home Living Family/patient expects to be discharged to:: Private residence Living Arrangements: Spouse/significant other Available Help at Discharge: Family;Available 24 hours/day Type of Home: House Home Access: Ramped entrance       Home  Layout: One level Home Equipment: Standard Walker;BSC/3in1;Wheelchair - Engineer, mining (4 wheels);Grab bars - tub/shower;Grab bars - toilet;Hand held shower head Additional Comments: two steps to get into her bed.    Prior Function Prior Level of Function : Needs assist             Mobility Comments: ambulates very short household distances with rollator (~10-15'), otherwise utilizes electric scooter ADLs Comments: Performing ADLs and husband helping her as needed prior to sx.     Hand Dominance   Dominant Hand: Right    Extremity/Trunk Assessment   Upper Extremity Assessment Upper Extremity Assessment: Overall WFL for tasks assessed    Lower Extremity Assessment Lower Extremity Assessment: Generalized weakness    Cervical / Trunk Assessment Cervical / Trunk Assessment: Back Surgery  Communication   Communication: No difficulties  Cognition Arousal/Alertness: Awake/alert Behavior During Therapy: WFL for tasks assessed/performed Overall Cognitive Status: Within Functional Limits for tasks assessed                                          General Comments General comments (skin integrity, edema, etc.): VSS on RA    Exercises     Assessment/Plan    PT Assessment Patient needs continued PT services  PT Problem List Decreased activity tolerance;Decreased balance;Decreased strength;Decreased mobility;Decreased knowledge of use of DME;Decreased safety awareness;Decreased knowledge of precautions;Pain       PT Treatment Interventions DME instruction;Gait training;Functional mobility training;Therapeutic activities;Therapeutic exercise;Balance training;Neuromuscular re-education;Patient/family education    PT Goals (Current goals can be found in the Care Plan section)  Acute Rehab PT Goals Patient Stated Goal: to go home PT Goal Formulation: With patient Time For Goal Achievement: 03/31/22 Potential to Achieve Goals: Good    Frequency Min 5X/week     Co-evaluation               AM-PAC PT "6 Clicks" Mobility   Outcome Measure Help needed turning from your back to your side while in a flat bed without using bedrails?: A Little Help needed moving from lying on your back to sitting on the side of a flat bed without using bedrails?: A Little Help needed moving to and from a bed to a chair (including a wheelchair)?: A Little Help needed standing up from a chair using your arms (e.g., wheelchair or bedside chair)?: A Little Help needed to walk in hospital room?: A Little Help needed climbing 3-5 steps with a railing? : A Lot 6 Click Score: 17    End of Session Equipment Utilized During Treatment: Back brace Activity Tolerance: Patient tolerated treatment well Patient left: in bed;with call bell/phone within reach Nurse Communication: Mobility status PT Visit Diagnosis: Other abnormalities of gait and mobility (R26.89)    Time: 3086-5784 PT Time Calculation (min) (ACUTE ONLY): 19 min   Charges:   PT Evaluation $PT Eval Low Complexity: Diamond, PT, DPT Acute Rehabilitation Office [phone]   Zenaida Niece 03/26/2022, 11:50 AM

## 2022-03-26 NOTE — Evaluation (Signed)
Occupational Therapy Evaluation Patient Details Name: Jeanette Bennett MRN: [id number] DOB: 05/15/1951 Today's Date: 03/26/2022   History of Present Illness 71 y.o. female presents to [facility] hospital on 03/25/2022 with worsening low back pain. Imaging demonstrates spondylolisthesis at L4-5 with disc herniation. Pt underwent L4-S1 decompression and fusion on 11/3. PMH includes anxiety, OA, depression, HTN.   Clinical Impression   PTA, pt was living with her husband and who assisted with ADLs as needed. Currently, pt requires Min Guard-Min A for UB ADLs, Min A for LB ADLs, and Min guard A for safety functional mobility with RW. Provided education on back precautions, bed mobility, brace management, grooming, LB ADLs, and functional transfers; pt demonstrated understanding. Pt would benefit form further acute OT to facilitate safe dc. Recommend dc home once medically stable per physician.     Recommendations for follow up therapy are one component of a multi-disciplinary discharge planning process, led by the attending physician.  Recommendations may be updated based on patient status, additional functional criteria and insurance authorization.   Follow Up Recommendations  No OT follow up    Assistance Recommended at Discharge Frequent or constant Supervision/Assistance  Patient can return home with the following      Functional Status Assessment  Patient has had a recent decline in their functional status and demonstrates the ability to make significant improvements in function in a reasonable and predictable amount of time.  Equipment Recommendations  None recommended by OT    Recommendations for Other Services       Precautions / Restrictions Precautions Precautions: Fall;Back Precaution Booklet Issued: No Required Braces or Orthoses: Spinal Brace Spinal Brace: Lumbar corset;Applied in sitting position Restrictions Weight Bearing Restrictions: No      Mobility Bed  Mobility Overal bed mobility: Needs Assistance Bed Mobility: Rolling, Sidelying to Sit Rolling: Min guard Sidelying to sit: Min guard       General bed mobility comments: Cues for log roll    Transfers Overall transfer level: Needs assistance Equipment used: Rolling walker (2 wheels) Transfers: Sit to/from Stand Sit to Stand: Min guard           General transfer comment: Min Guard A for safety      Balance Overall balance assessment: Needs assistance Sitting-balance support: No upper extremity supported, Feet supported Sitting balance-Leahy Scale: Fair     Standing balance support: Bilateral upper extremity supported, During functional activity Standing balance-Leahy Scale: Poor                             ADL either performed or assessed with clinical judgement   ADL Overall ADL's : Needs assistance/impaired Eating/Feeding: Set up;Sitting   Grooming: Set up;Sitting Grooming Details (indicate cue type and reason): Educating on compensatory tehcniques for oral care Upper Body Bathing: Minimal assistance;Sitting Upper Body Bathing Details (indicate cue type and reason): washing back with husband's help. Lower Body Bathing: Min guard   Upper Body Dressing : Min guard;Sitting Upper Body Dressing Details (indicate cue type and reason): Educating on donning brace Lower Body Dressing: Minimal assistance;Sit to/from stand Lower Body Dressing Details (indicate cue type and reason): Pt particiapting in simulated activity to don LB clothing such as pants. Educating on donning RLE first and then LLE. Pt will need assistance for socks and shoes which husband plans on doing for her. Toilet Transfer: Min guard;Ambulation;Rolling walker (2 wheels) (simulated to recliner)   Toileting- Clothing Manipulation and Hygiene: Minimal assistance;Sit to/from stand  Toileting - Clothing Manipulation Details (indicate cue type and reason): Discussing toilet hygiene. However, pt may  need more education on use of toilet aide     Functional mobility during ADLs: Min guard;Rolling walker (2 wheels) General ADL Comments: Pt presenting with slight decreased in ROM, balance, and activity tolerance. Providing eudcaiton on back precautions     Vision         Perception     Praxis      Pertinent Vitals/Pain Pain Assessment Pain Assessment: Faces Faces Pain Scale: Hurts little more Pain Location: Back Pain Descriptors / Indicators: Discomfort, Grimacing Pain Intervention(s): Monitored during session, Repositioned     Hand Dominance Right   Extremity/Trunk Assessment Upper Extremity Assessment Upper Extremity Assessment: Overall WFL for tasks assessed   Lower Extremity Assessment Lower Extremity Assessment: Defer to PT evaluation   Cervical / Trunk Assessment Cervical / Trunk Assessment: Back Surgery   Communication Communication Communication: No difficulties   Cognition Arousal/Alertness: Awake/alert Behavior During Therapy: WFL for tasks assessed/performed Overall Cognitive Status: Within Functional Limits for tasks assessed                                       General Comments  husband present throughout    Exercises     Shoulder Instructions      Home Living Family/patient expects to be discharged to:: Private residence Living Arrangements: Spouse/significant other Available Help at Discharge: Family;Available 24 hours/day Type of Home: House Home Access: Ramped entrance     Home Layout: One level     Bathroom Shower/Tub: Tub/shower unit;Walk-in shower   Bathroom Toilet: Handicapped height     Home Equipment: Standard Walker;BSC/3in1;Wheelchair - Psychologist, sport and exercise (4 wheels);Grab bars - tub/shower;Grab bars - toilet;Hand held shower head   Additional Comments: two steps to get into her bed.      Prior Functioning/Environment Prior Level of Function : Needs assist             Mobility Comments:  Using rollator ADLs Comments: Performing ADLs and husband helping her as needed prior to sx.        OT Problem List: Decreased strength;Decreased range of motion;Decreased activity tolerance;Impaired balance (sitting and/or standing);Decreased knowledge of use of DME or AE;Decreased knowledge of precautions;Pain      OT Treatment/Interventions: Self-care/ADL training;Therapeutic exercise;Energy conservation;DME and/or AE instruction;Therapeutic activities;Patient/family education    OT Goals(Current goals can be found in the care plan section) Acute Rehab OT Goals Patient Stated Goal: Go home OT Goal Formulation: With patient/family Time For Goal Achievement: 04/09/22 Potential to Achieve Goals: Good  OT Frequency: Min 3X/week    Co-evaluation              AM-PAC OT "6 Clicks" Daily Activity     Outcome Measure Help from another person eating meals?: None Help from another person taking care of personal grooming?: A Little Help from another person toileting, which includes using toliet, bedpan, or urinal?: A Little Help from another person bathing (including washing, rinsing, drying)?: A Little Help from another person to put on and taking off regular upper body clothing?: A Little Help from another person to put on and taking off regular lower body clothing?: A Little 6 Click Score: 19   End of Session Equipment Utilized During Treatment: Rolling walker (2 wheels);Back brace Nurse Communication: Mobility status  Activity Tolerance: Patient tolerated treatment well Patient left: in chair;with call bell/phone  within reach  OT Visit Diagnosis: Unsteadiness on feet (R26.81);Other abnormalities of gait and mobility (R26.89);Muscle weakness (generalized) (M62.81);Pain                Time: 8850-2774 OT Time Calculation (min): 25 min Charges:  OT General Charges $OT Visit: 1 Visit OT Evaluation $OT Eval Low Complexity: 1 Low OT Treatments $Self Care/Home Management : 8-22  mins  Jeanette Bennett MSOT, OTR/L Acute Rehab Office: [phone]  Jeanette Bennett Jeanette Bennett 03/26/2022, 11:09 AM

## 2022-03-26 NOTE — Progress Notes (Signed)
NEUROSURGERY PROGRESS NOTE  Doing well. Complains of appropriate back soreness. No leg pain No numbness, tingling or weakness Good strength and sensation Incision CDI  Temp:  [97.5 F (36.4 C)-99.2 F (37.3 C)] 97.9 F (36.6 C) (11/04 0318) Pulse Rate:  [59-72] 72 (11/04 0318) Resp:  [10-17] 13 (11/04 0318) BP: (95-132)/(56-78) 109/56 (11/04 0318) SpO2:  [82 %-100 %] 98 % (11/04 0318)  Plan: Please ambulate patient several times today in the hallways, plan for discharge tomorrow morning.   Eleonore Chiquito, NP 03/26/2022 7:56 AM

## 2022-03-27 MED ORDER — DEXAMETHASONE SODIUM PHOSPHATE 4 MG/ML IJ SOLN
4.0000 mg | Freq: Four times a day (QID) | INTRAMUSCULAR | Status: AC
  Administered 2022-03-27 – 2022-03-28 (×4): 4 mg via INTRAVENOUS
  Filled 2022-03-27 (×4): qty 1

## 2022-03-27 NOTE — Progress Notes (Signed)
Mobility Specialist Progress Note   03/27/22 0900  Pain Assessment  Pain Assessment 0-10  Pain Score 8  Pain Location Lower Back  Pain Descriptors / Indicators Grimacing;Guarding;Shooting;Stabbing  Pain Intervention(s) Limited activity within patient's tolerance;Patient requesting pain meds-RN notified  Mobility  Activity Ambulated with assistance in hallway;Transferred from bed to chair  Level of Assistance Contact guard assist, steadying assist  Assistive Device Front wheel walker  Distance Ambulated (ft) 5 ft  Range of Motion/Exercises Active;All extremities  Activity Response Tolerated fair;RN notified   Patient received in supine, visibly distressed and complaining of 8/10 lower back pain. Very reluctant to participate initially but agreeable to OOB with max encouragement and education. Was able to recite 3/3 back precautions but mentioned she had been twisting in bed to get comfortable. Reinforced education and importance of adhering to precautions with receptiveness from patient. Required minimal HHA + mod cues to adhere to precautions, and extra time to get EOB from supine to sit. Attempted to stand x2 with min A but unable to clear hips secondary to shooting/sharp pain that radiated from lower back and down LLE. Was able to stand min guard from elevated bed height and take steps to recliner chair. Tolerated without incident but back pain a significant limiting factor in mobility progression. Was left in recliner with all needs met, call bell in reach.   Jeanette Bennett, [place], [place]  Office: [phone]

## 2022-03-27 NOTE — Progress Notes (Signed)
NEUROSURGERY PROGRESS NOTE  S/p lumbar fusion. Having a little more pain in her back and leg this morning. We will put her on some steroids to see if this calms it down. Ambulate with therapy today.  Temp:  [97.5 F (36.4 C)-99.5 F (37.5 C)] 99.5 F (37.5 C) (11/05 0404) Pulse Rate:  [78-87] 87 (11/05 0404) Resp:  [16-18] 18 (11/05 0404) BP: (103-119)/(54-97) 119/97 (11/05 0404) SpO2:  [96 %-100 %] 99 % (11/05 0404)   Eleonore Chiquito, NP 03/27/2022 9:03 AM

## 2022-03-27 NOTE — Progress Notes (Signed)
PT Cancellation Note  Patient Details Name: Jeanette Bennett MRN: [id number] DOB: 1951-02-17   Cancelled Treatment:    Reason Eval/Treat Not Completed: Pain limiting ability to participate. Pt reports shooting pain down anterior LLE, refusing to mobilize at this time. PT will attempt to follow up as time allows.   Zenaida Niece 03/27/2022, 5:30 PM

## 2022-03-28 NOTE — Progress Notes (Signed)
Patient with difficulty mobilizing secondary to back pain.  She does state that her lower extremity pain numbness and weakness are much improved from surgery.  She is having no abdominal complaints.  She has no other difficulties otherwise.  She thinks that she will probably be ready to go home tomorrow.  She is afebrile.  Her vital signs are stable.  Urine output is good.  She is awake and alert.  She is oriented and appropriate.  Motor and sensory function are intact and significantly improved from preop state.  She has no pain with straight leg raising.  Her wound is clean and dry.  Chest and abdomen are benign.  Progressing about as would be expected following two-level lumbar decompression and fusion surgery in someone who is severely deconditioned preoperatively.  Continue efforts at mobilization.  Hope for discharge tomorrow.

## 2022-03-28 NOTE — Care Management Important Message (Signed)
Important Message  Patient Details  Name: Jeanette Bennett MRN: [id number] Date of Birth: 1950/11/16   Medicare Important Message Given:  Yes     Hannah Beat 03/28/2022, 12:29 PM

## 2022-03-28 NOTE — Plan of Care (Signed)
  Problem: Education: Goal: Knowledge of General Education information will improve Description: Including pain rating scale, medication(s)/side effects and non-pharmacologic comfort measures Outcome: Progressing   Problem: Health Behavior/Discharge Planning: Goal: Ability to manage health-related needs will improve Outcome: Progressing   Problem: Health Behavior/Discharge Planning: Goal: Ability to manage health-related needs will improve Outcome: Progressing   Problem: Health Behavior/Discharge Planning: Goal: Ability to manage health-related needs will improve Outcome: Progressing   Problem: Clinical Measurements: Goal: Diagnostic test results will improve Outcome: Progressing Goal: Respiratory complications will improve Outcome: Progressing Goal: Cardiovascular complication will be avoided Outcome: Progressing   Problem: Activity: Goal: Risk for activity intolerance will decrease Outcome: Progressing   Problem: Nutrition: Goal: Adequate nutrition will be maintained Outcome: Progressing   Problem: Coping: Goal: Level of anxiety will decrease Outcome: Progressing   Problem: Pain Managment: Goal: General experience of comfort will improve Outcome: Progressing   Problem: Safety: Goal: Ability to remain free from injury will improve Outcome: Progressing   Problem: Skin Integrity: Goal: Risk for impaired skin integrity will decrease Outcome: Progressing

## 2022-03-28 NOTE — Progress Notes (Signed)
Physical Therapy Treatment Patient Details Name: Jeanette Bennett MRN: [id number] DOB: January 02, 1951 Today's Date: 03/28/2022   History of Present Illness 71 y.o. female presents to [facility] hospital on 03/25/2022 with worsening low back pain. Imaging demonstrates spondylolisthesis at L4-5 with disc herniation. Pt underwent L4-S1 decompression and fusion on 11/3. PMH includes anxiety, OA, depression, HTN.    PT Comments    Pt required min guard assist bed mobility, transfers, and amb 125' with RW. 3/3 back precautions reviewed. Pt in recliner at end of session.    Recommendations for follow up therapy are one component of a multi-disciplinary discharge planning process, led by the attending physician.  Recommendations may be updated based on patient status, additional functional criteria and insurance authorization.  Follow Up Recommendations  Outpatient PT     Assistance Recommended at Discharge PRN  Patient can return home with the following A little help with walking and/or transfers;A little help with bathing/dressing/bathroom;Assistance with cooking/housework;Assist for transportation;Help with stairs or ramp for entrance   Equipment Recommendations  None recommended by PT    Recommendations for Other Services       Precautions / Restrictions Precautions Precautions: Fall;Back Precaution Comments: reviewed 3/3 back precautions Required Braces or Orthoses: Spinal Brace Spinal Brace: Lumbar corset;Applied in sitting position     Mobility  Bed Mobility Overal bed mobility: Needs Assistance Bed Mobility: Rolling, Sidelying to Sit Rolling: Min guard Sidelying to sit: Min guard       General bed mobility comments: +rail, increased time, cues for sequencing    Transfers Overall transfer level: Needs assistance Equipment used: Rolling walker (2 wheels) Transfers: Sit to/from Stand Sit to Stand: Min guard           General transfer comment: cues for hand placement,  increased time to power up    Ambulation/Gait Ambulation/Gait assistance: Min guard Gait Distance (Feet): 125 Feet Assistive device: Rolling walker (2 wheels) Gait Pattern/deviations: Step-through pattern, Decreased stride length, Trunk flexed Gait velocity: decreased Gait velocity interpretation: <1.31 ft/sec, indicative of household ambulator   General Gait Details: cues for posture and proximity to [employer]    Modified Rankin (Stroke Patients Only)       Balance Overall balance assessment: Needs assistance Sitting-balance support: No upper extremity supported, Feet supported Sitting balance-Leahy Scale: Good     Standing balance support: During functional activity, Reliant on assistive device for balance, Bilateral upper extremity supported Standing balance-Leahy Scale: Poor                              Cognition Arousal/Alertness: Awake/alert Behavior During Therapy: WFL for tasks assessed/performed Overall Cognitive Status: Within Functional Limits for tasks assessed                                          Exercises      General Comments General comments (skin integrity, edema, etc.): VSS on RA      Pertinent Vitals/Pain Pain Assessment Pain Assessment: 0-10 Pain Score: 6  Pain Location: back Pain Descriptors / Indicators: Discomfort, Grimacing, Operative site guarding Pain Intervention(s): Monitored during session, Repositioned, RN gave pain meds during session    Home Living  Prior Function            PT Goals (current goals can now be found in the care plan section) Acute Rehab PT Goals Patient Stated Goal: home Progress towards PT goals: Progressing toward goals    Frequency    Min 5X/week      PT Plan Current plan remains appropriate    Co-evaluation              AM-PAC PT "6 Clicks" Mobility   Outcome Measure  Help  needed turning from your back to your side while in a flat bed without using bedrails?: A Little Help needed moving from lying on your back to sitting on the side of a flat bed without using bedrails?: A Little Help needed moving to and from a bed to a chair (including a wheelchair)?: A Little Help needed standing up from a chair using your arms (e.g., wheelchair or bedside chair)?: A Little Help needed to walk in hospital room?: A Little Help needed climbing 3-5 steps with a railing? : A Lot 6 Click Score: 17    End of Session Equipment Utilized During Treatment: Back brace;Gait belt Activity Tolerance: Patient tolerated treatment well Patient left: in chair;with call bell/phone within reach Nurse Communication: Mobility status PT Visit Diagnosis: Other abnormalities of gait and mobility (R26.89)     Time: [id number] PT Time Calculation (min) (ACUTE ONLY): 25 min  Charges:  $Gait Training: 23-37 mins                     Lorrin Goodell, PT  Office # [phone] Pager [phone]    Jeanette Bennett 03/28/2022, 11:44 AM

## 2022-03-28 NOTE — Anesthesia Postprocedure Evaluation (Signed)
Anesthesia Post Note  Patient: [name] [facility]  Procedure(s) Performed: LUMBAR FOUR-FIVE, LUMBAR FIVE-SACRAL ONE POSTERIOR LUMBAR INTERBODY FUSION (Spine Lumbar)     Patient location during evaluation: PACU Anesthesia Type: General Level of consciousness: awake and alert Pain management: pain level controlled Vital Signs Assessment: post-procedure vital signs reviewed and stable Respiratory status: spontaneous breathing, nonlabored ventilation, respiratory function stable and patient connected to nasal cannula oxygen Cardiovascular status: blood pressure returned to baseline and stable Postop Assessment: no apparent nausea or vomiting Anesthetic complications: no  No notable events documented.  Last Vitals:  Vitals:   03/28/22 0402 03/28/22 0406  BP: (!) 94/53   Pulse: (!) 58   Resp: 10 18  Temp: 36.5 C   SpO2: 96%     Last Pain:  Vitals:   03/28/22 0402  TempSrc: Oral  PainSc:                  Handy Mcloud S

## 2022-03-28 NOTE — Progress Notes (Signed)
Occupational Therapy Treatment and Discharge Patient Details Name: Jeanette Bennett MRN: [id number] DOB: 1950-12-16 Today's Date: 03/28/2022   History of present illness 71 y.o. female presents to [facility] hospital on 03/25/2022 with worsening low back pain. Imaging demonstrates spondylolisthesis at L4-5 with disc herniation. Pt underwent L4-S1 decompression and fusion on 11/3. PMH includes anxiety, OA, depression, HTN.   OT comments  Reinforced back precautions and educated pt in use and availability of AE for LB ADLs and pericare. Pt is not interested in AE, prefers to rely on her husband as she did prior to admission. Pt is mobilizing with RW and min guard assist. No further OT needs, recommending ADLs with nursing staff.    Recommendations for follow up therapy are one component of a multi-disciplinary discharge planning process, led by the attending physician.  Recommendations may be updated based on patient status, additional functional criteria and insurance authorization.    Follow Up Recommendations  No OT follow up    Assistance Recommended at Discharge Set up Supervision/Assistance  Patient can return home with the following  A little help with walking and/or transfers;A lot of help with bathing/dressing/bathroom;Assistance with cooking/housework;Direct supervision/assist for financial management;Help with stairs or ramp for entrance;Assist for transportation   Equipment Recommendations  None recommended by OT    Recommendations for Other Services      Precautions / Restrictions Precautions Precautions: Fall;Back Required Braces or Orthoses: Spinal Brace Spinal Brace: Lumbar corset;Applied in sitting position       Mobility Bed Mobility Overal bed mobility: Needs Assistance Bed Mobility: Rolling, Sidelying to Sit, Sit to Sidelying Rolling: Min guard Sidelying to sit: Min guard     Sit to sidelying: Min guard      Transfers Overall transfer level: Needs  assistance Equipment used: Rolling walker (2 wheels) Transfers: Sit to/from Stand Sit to Stand: Min guard           General transfer comment: increased time, cues for technique     Balance                                           ADL either performed or assessed with clinical judgement   ADL Overall ADL's : Needs assistance/impaired     Grooming: Wash/dry hands;Oral care;Supervision/safety;Standing     Upper Body Bathing Details (indicate cue type and reason): pt has a long handled bath sponge for her back   Lower Body Bathing Details (indicate cue type and reason): pt has a long handled bath sponge for her feet Upper Body Dressing : Set up;Sitting     Lower Body Dressing Details (indicate cue type and reason): pt prefers to rely on her husband Toilet Transfer: Min guard;Ambulation   Toileting- Clothing Manipulation and Hygiene: Total assistance Toileting - Clothing Manipulation Details (indicate cue type and reason): educated in use of toileting tongs, pt prefer to continue relying on her husband     Functional mobility during ADLs: Min guard;Rolling walker (2 wheels)      Extremity/Trunk Assessment              Vision       Perception     Praxis      Cognition Arousal/Alertness: Awake/alert Behavior During Therapy: WFL for tasks assessed/performed Overall Cognitive Status: Within Functional Limits for tasks assessed  Exercises      Shoulder Instructions       General Comments VSS on RA    Pertinent Vitals/ Pain       Pain Assessment Pain Assessment: Faces Pain Score: 4  Pain Location: back Pain Descriptors / Indicators: Discomfort Pain Intervention(s): Monitored during session, Repositioned  Home Living                                          Prior Functioning/Environment              Frequency           Progress Toward  Goals  OT Goals(current goals can now be found in the care plan section)  Progress towards OT goals: Goals met/education completed, patient discharged from [place] Discharge plan remains appropriate;All goals met and education completed, patient discharged from OT services    Co-evaluation                 AM-PAC OT "6 Clicks" Daily Activity     Outcome Measure                    End of Session Equipment Utilized During Treatment: Rolling walker (2 wheels);Gait belt;Back brace  OT Visit Diagnosis: Muscle weakness (generalized) (M62.81);Pain   Activity Tolerance Patient tolerated treatment well   Patient Left in bed;with family/visitor present;with bed alarm set;with call bell/phone within reach   Nurse Communication Other (comment) (pt asking for her potassium, states she takes it at home)        Time: 9470-9628 OT Time Calculation (min): 15 min  Charges: OT General Charges $OT Visit: 1 Visit OT Treatments $Self Care/Home Management : 8-22 mins  Cleta Alberts, OTR/L Acute Rehabilitation Services Office: [phone]   Malka So 03/28/2022, 3:35 PM

## 2022-03-28 NOTE — Progress Notes (Signed)
Pt stated the MD said he would start her on steroids - pt wanting RN to contact MD.  Attempted to contact MD Ronnald Ramp.  Will continue to monitor.

## 2022-03-29 NOTE — Progress Notes (Signed)
Physical Therapy Treatment Patient Details Name: Jeanette Bennett MRN: [id number] DOB: 03/27/1951 Today's Date: 03/29/2022   History of Present Illness 71 y.o. female presents to [facility] hospital on 03/25/2022 with worsening low back pain. Imaging demonstrates spondylolisthesis at L4-5 with disc herniation. Pt underwent L4-S1 decompression and fusion on 11/3. PMH includes anxiety, OA, depression, HTN.    PT Comments    Pt received in bed, reporting not sleeping well last night. She required supervision bed mobility, min guard assist transfers, and min guard assist amb 25' with RW. Gait distance limited by pain and fatigue. Pt in recliner at end of session.    Recommendations for follow up therapy are one component of a multi-disciplinary discharge planning process, led by the attending physician.  Recommendations may be updated based on patient status, additional functional criteria and insurance authorization.  Follow Up Recommendations  Outpatient PT     Assistance Recommended at Discharge PRN  Patient can return home with the following A little help with walking and/or transfers;A little help with bathing/dressing/bathroom;Assistance with cooking/housework;Assist for transportation;Help with stairs or ramp for entrance   Equipment Recommendations  None recommended by PT    Recommendations for Other Services       Precautions / Restrictions Precautions Precautions: Fall;Back Precaution Comments: reviewed 3/3 back precautions Required Braces or Orthoses: Spinal Brace Spinal Brace: Lumbar corset;Applied in sitting position     Mobility  Bed Mobility Overal bed mobility: Needs Assistance Bed Mobility: Rolling, Sidelying to Sit Rolling: Modified independent (Device/Increase time) Sidelying to sit: Supervision       General bed mobility comments: +rail, increased time    Transfers Overall transfer level: Needs assistance Equipment used: Rolling walker (2  wheels) Transfers: Sit to/from Stand Sit to Stand: Min guard           General transfer comment: increased time    Ambulation/Gait Ambulation/Gait assistance: Min guard Gait Distance (Feet): 25 Feet Assistive device: Rolling walker (2 wheels) Gait Pattern/deviations: Step-through pattern, Decreased stride length, Trunk flexed Gait velocity: decreased Gait velocity interpretation: <1.31 ft/sec, indicative of household ambulator   General Gait Details: distance limited by pain and fatigue. Pt reports not sleeping well last night.   Stairs Stairs:  (Pt has ramped entry. Also has w/c and/or scooter if needed.)           Wheelchair Mobility    Modified Rankin (Stroke Patients Only)       Balance Overall balance assessment: Needs assistance Sitting-balance support: No upper extremity supported, Feet supported Sitting balance-Leahy Scale: Good     Standing balance support: During functional activity, Reliant on assistive device for balance, Bilateral upper extremity supported Standing balance-Leahy Scale: Poor                              Cognition Arousal/Alertness: Awake/alert Behavior During Therapy: WFL for tasks assessed/performed Overall Cognitive Status: Within Functional Limits for tasks assessed                                          Exercises      General Comments General comments (skin integrity, edema, etc.): VSS on RA      Pertinent Vitals/Pain Pain Assessment Pain Assessment: Faces Faces Pain Scale: Hurts little more Pain Location: back Pain Descriptors / Indicators: Discomfort Pain Intervention(s): Limited activity within patient's tolerance, Monitored during  session, Repositioned    Home Living                          Prior Function            PT Goals (current goals can now be found in the care plan section) Acute Rehab PT Goals Patient Stated Goal: home Progress towards PT goals:  Progressing toward goals    Frequency    Min 5X/week      PT Plan Current plan remains appropriate    Co-evaluation              AM-PAC PT "6 Clicks" Mobility   Outcome Measure  Help needed turning from your back to your side while in a flat bed without using bedrails?: None Help needed moving from lying on your back to sitting on the side of a flat bed without using bedrails?: A Little Help needed moving to and from a bed to a chair (including a wheelchair)?: A Little Help needed standing up from a chair using your arms (e.g., wheelchair or bedside chair)?: A Little Help needed to walk in hospital room?: A Little Help needed climbing 3-5 steps with a railing? : A Lot 6 Click Score: 18    End of Session Equipment Utilized During Treatment: Back brace Activity Tolerance: Patient limited by pain;Patient limited by fatigue Patient left: in chair;with call bell/phone within reach Nurse Communication: Mobility status PT Visit Diagnosis: Other abnormalities of gait and mobility (R26.89)     Time: [id number] PT Time Calculation (min) (ACUTE ONLY): 23 min  Charges:  $Gait Training: 23-37 mins                     Lorrin Goodell, [place]  Office # [phone] Pager [phone]    Lorriane Shire 03/29/2022, 9:30 AM

## 2022-03-29 NOTE — Progress Notes (Signed)
Overall stable.  No new issues or problems.  Pain seems better controlled.  Still complaining of some left anterior leg pain.  No weakness.  No numbness.  She is afebrile.  Her vital signs are stable.  Her urine output is good.  She has had a bowel movement.  Her motor and sensory function are intact.  Her wound is clean and dry.  Patient progressing reasonably well.  She still does not feel that her mobility or pain control was such that she can tolerate going home today but does feel like tomorrow she will be able to get home.  We will continue with rehab efforts.  I will plan on discharge tomorrow.

## 2022-03-29 NOTE — Progress Notes (Signed)
Notified Dr. Annette Stable in regards to patient's incision draining serosanguinous drainage with ABD being replaced twice since yesterday evening. Incision is intact. Dr. Annette Stable aware states this is due to anticoagulation and to just keep clean dressing over top.

## 2022-03-29 NOTE — TOC Initial Note (Signed)
Transition of Care (TOC) - Initial/Assessment Note  Marvetta Gibbons RN,BSN Transitions of Care Unit 4NP (Non Trauma)- RN Case Manager See Treatment Team for direct Phone #   Patient Details  Name: Jeanette Bennett MRN: [id number] Date of Birth: [birth year]  Transition of Care [facility]) CM/SW Contact:    Dawayne Patricia, RN Phone Number: 03/29/2022, 3:40 PM  Clinical Narrative:                 Noted recommendations for outpt PT- CM spoke with pt at bedside- per pt she is agreeable to recommendations- reports she has done outpt therapy before- and prefers [facility] in [place]- closest to home.  Pt reports she has needed DME- including 3[employer]1- declined any new DME needs. Spouse to transport home.   Pt will need script to take for outpt PT (or Dr Marchelle Folks office to call in referral) to [place], [place] Phone: [phone] (fax- [phone])   Expected Discharge Plan: OP Rehab Barriers to Discharge: Continued Medical Work up   Patient Goals and CMS Choice Patient states their goals for this hospitalization and ongoing recovery are:: return home CMS Medicare.gov Compare Post Acute Care list provided to:: Patient Choice offered to / list presented to : Patient  Expected Discharge Plan and Services Expected Discharge Plan: OP Rehab   Discharge Planning Services: CM Consult Post Acute Care Choice: NA Living arrangements for the past 2 months: Single Family Home                 DME Arranged: 3-N-1, Patient refused services (pt reports she has needed DME at home) DME Agency: NA       HH Arranged: NA [place] Agency: NA        Prior Living Arrangements/Services Living arrangements for the past 2 months: Single Family Home Lives with:: Spouse Patient language and need for interpreter reviewed:: Yes Do you feel safe going back to the place where you live?: Yes      Need for Family Participation in Patient Care: Yes (Comment) Care giver  support system in place?: Yes (comment) Current home services: DME Criminal Activity/Legal Involvement Pertinent to Current Situation/Hospitalization: No - Comment as needed  Activities of Daily Living      Permission Sought/Granted                  Emotional Assessment Appearance:: Appears stated age Attitude/Demeanor/Rapport: Engaged Affect (typically observed): Accepting, Appropriate Orientation: : Oriented to Self, Oriented to Place, Oriented to  Time, Oriented to Situation Alcohol / Substance Use: Not Applicable Psych Involvement: No (comment)  Admission diagnosis:  Degenerative spondylolisthesis [M43.10] Patient Active Problem List   Diagnosis Date Noted   Degenerative spondylolisthesis 03/25/2022   Acute encephalopathy 05/17/2021   Degenerative joint disease of right hip 05/14/2021   Chronic right hip pain 05/03/2021   Primary localized osteoarthritis of right hip 05/03/2021   PCP:  Emelda Fear, DO Pharmacy:   [id number] - [place], [place] [address] [place] [address] Phone: [phone] Fax: [phone]     Social Determinants of Health (SDOH) Interventions    Readmission Risk Interventions     No data to display

## 2022-03-30 MED ORDER — OXYCODONE-ACETAMINOPHEN 5-325 MG PO TABS: 1.0000 | ORAL_TABLET | ORAL | 0 refills | Status: AC | PRN

## 2022-03-30 MED ORDER — DIAZEPAM 5 MG PO TABS: 5.0000 mg | ORAL_TABLET | Freq: Three times a day (TID) | ORAL | 0 refills | Status: AC | PRN

## 2022-03-30 NOTE — Progress Notes (Addendum)
PT Cancellation Note  Patient Details Name: Jeanette Bennett MRN: [id number] DOB: 25-May-1950   Cancelled Treatment:    Reason Eval/Treat Not Completed: Patient declined, no reason specified. PT attempted to see this pt 3 times this morning. Pt reports recently returning to bed prior to PT arrival each time. Nursing staff reports the pt has only been out of bed once this morning to their knowledge. PT provides max encouragement for participation however the pt continues to refuse. PT will attempt to follow up tomorrow if the pt is a willing participant and remains admitted.  Arlyss Gandy 03/30/2022, 11:52 AM

## 2022-03-30 NOTE — Discharge Summary (Signed)
Physician Discharge Summary     Providing Compassionate, Quality Care - Together   Patient ID: Jeanette Bennett MRN: [id number] DOB/AGE: 1951/05/09 71 y.o.  Admit date: 03/25/2022 Discharge date: 03/30/2022  Admission Diagnoses: Degenerative spondylolisthesis  Discharge Diagnoses:  Principal Problem:   Degenerative spondylolisthesis   Discharged Condition: good  Hospital Course: Patient underwent an L4-5, L5-S1 PLIF by Dr. Jordan Likes on 03/25/2022. She was admitted to [facility] following recovery from anesthesia in the PACU. Her postoperative course has been complicated by pain control issues. She has worked with both physical and occupational therapies who feel the patient is ready for discharge home with outpatient PT. She is ambulating independently with the aid of a walker. She is tolerating a normal diet. She is not having any bowel or bladder dysfunction. Her pain is reasonably controlled with oral pain medication. She is ready for discharge home.   Consults: PT/OT  Significant Diagnostic Studies: radiology: DG Lumbar Spine 2-3 Views  Result Date: 03/25/2022 CLINICAL DATA:  Fluoroscopic assistance for lumbar spine surgery EXAM: LUMBAR SPINE - 2-3 VIEW COMPARISON:  MR lumbar spine done on 08/28/2021 FINDINGS: Fluoroscopic images show posterior surgical fusion at the L4-L5 and L5-S1 levels. Intervertebral disc spaces are noted in place. Fluoroscopic time 48 seconds. Radiation dose 41.77 mGy. IMPRESSION: Fluoroscopic assistance was provided for lumbar spinal fusion. Electronically Signed   By: Ernie Avena M.D.   On: 03/25/2022 13:12   DG C-Arm 1-60 Min-No Report  Result Date: 03/25/2022 Fluoroscopy was utilized by the requesting physician.  No radiographic interpretation.   DG C-Arm 1-60 Min-No Report  Result Date: 03/25/2022 Fluoroscopy was utilized by the requesting physician.  No radiographic interpretation.   DG C-Arm 1-60 Min-No Report  Result Date:  03/25/2022 Fluoroscopy was utilized by the requesting physician.  No radiographic interpretation.     Treatments: surgery: Bilateral L4-5 and L5-S1 decompressive laminotomies and foraminotomies more extensive than would be required for simple interbody fusion alone.   L4-5, L5-S1 posterior lumbar interbody fusion utilizing interbody cages, locally harvested autograft, morselized allograft   L4-5 and S1 posterior lateral arthrodesis utilizing segmental pedicle screw fixation and local autograft.  Discharge Exam: Blood pressure 114/64, pulse 75, temperature 97.6 F (36.4 C), temperature source Oral, resp. rate 17, height 5\' 11"  (1.803 m), weight 108.9 kg, SpO2 94 %.  Alert and oriented x 4 PERRLA CN II-XII grossly intact MAE, Strength and sensation intact Incision is covered with gauze dressing; Dressing with moderate amount of serosanguinous drainage. Staple applied at 1/2 cm draining area of incision at the top of the surgical wound.   Disposition: Discharge disposition: 01-Home or Self Care       Discharge Instructions     Call MD for:  difficulty breathing, headache or visual disturbances   Complete by: As directed    Call MD for:  persistant nausea and vomiting   Complete by: As directed    Call MD for:  redness, tenderness, or signs of infection (pain, swelling, redness, odor or green/yellow discharge around incision site)   Complete by: As directed    Call MD for:  severe uncontrolled pain   Complete by: As directed    Call MD for:  temperature >100.4   Complete by: As directed    Change dressing (specify)   Complete by: As directed    Dressing change: 2 times per day and as needed to keep surgical wound dry using gauze dressing and tape.   Diet - low sodium heart healthy  Complete by: As directed    Increase activity slowly   Complete by: As directed       Allergies as of 03/30/2022       Reactions   Other Shortness Of Breath   Tree Nuts   Sulfa Antibiotics  Nausea And Vomiting, Rash        Medication List     STOP taking these medications    meloxicam 15 MG tablet Commonly known as: MOBIC       TAKE these medications    acetaminophen 650 MG CR tablet Commonly known as: TYLENOL Take 1,300 mg by mouth every 8 (eight) hours as needed for pain.   alendronate 70 MG tablet Commonly known as: FOSAMAX Take 70 mg by mouth every Monday. Take with a full glass of water on an empty stomach.   amLODipine 10 MG tablet Commonly known as: NORVASC Take 10 mg by mouth daily.   atorvastatin 80 MG tablet Commonly known as: LIPITOR Take 40 mg by mouth daily.   calcium carbonate 500 MG chewable tablet Commonly known as: TUMS - dosed in mg elemental calcium Chew 1,500 mg by mouth 3 (three) times daily as needed for indigestion or heartburn.   chlorhexidine 0.12 % solution Commonly known as: PERIDEX Use as directed 15 mLs in the mouth or throat 2 (two) times daily.   cholecalciferol 25 MCG (1000 UNIT) tablet Commonly known as: VITAMIN D3 Take 2,000 Units by mouth daily.   colchicine 0.6 MG tablet Take 0.6-1.2 mg by mouth See admin instructions. Take 1.2 mg at onset of gout flare then take 0.6 mg 1 hour later as needed for gout   diazepam 5 MG tablet Commonly known as: VALIUM Take 1-2 tablets (5-10 mg total) by mouth every 8 (eight) hours as needed for muscle spasms.   diclofenac Sodium 1 % Gel Commonly known as: VOLTAREN Apply 1 application. topically 4 (four) times daily as needed (pain).   Digestive Adv Digestive/Immune Caps Take 2 capsules by mouth 2 (two) times a week.   DULoxetine 30 MG capsule Commonly known as: CYMBALTA Take 30 mg by mouth 2 (two) times daily.   esomeprazole 40 MG capsule Commonly known as: NEXIUM Take 40 mg by mouth daily.   gabapentin 100 MG capsule Commonly known as: NEURONTIN Take 100 mg by mouth 2 (two) times daily.   loratadine 10 MG tablet Commonly known as: CLARITIN Take 10 mg by mouth  daily.   multivitamin with minerals Tabs tablet Take 1 tablet by mouth daily.   Nebivolol HCl 20 MG Tabs Take 20 mg by mouth daily.   oxyCODONE-acetaminophen 5-325 MG tablet Commonly known as: Percocet Take 1-2 tablets by mouth every 4 (four) hours as needed for severe pain.   potassium chloride 10 MEQ tablet Commonly known as: KLOR-CON M Take 1 tablet (10 mEq total) by mouth daily.   triamterene-hydrochlorothiazide 75-50 MG tablet Commonly known as: MAXZIDE Take 1 tablet by mouth daily.   valsartan 160 MG tablet Commonly known as: DIOVAN Take 160 mg by mouth daily.               Durable Medical Equipment  (From admission, onward)           Start     Ordered   03/25/22 1602  DME Walker rolling  Once       Question:  Patient needs a walker to treat with the following condition  Answer:  Degenerative spondylolisthesis   03/25/22 1601   03/25/22 1602  DME 3 n 1  Once        03/25/22 1601              Discharge Care Instructions  (From admission, onward)           Start     Ordered   03/30/22 0000  Change dressing (specify)       Comments: Dressing change: 2 times per day and as needed to keep surgical wound dry using gauze dressing and tape.   03/30/22 1053            Follow-up Information     Earnie Larsson, MD. Go on 04/05/2022.   Specialty: Neurosurgery Why: First post op appointment is on 04/05/2022 at 4:45 PM. Contact information: [address] [place] [address] [phone]         [facility] Follow up.   Why: Provide referral form and schedule at office. Contact information: [address] [place] [address] [phone]                 Signed: Viona Gilmore, DNP, AGNP-C Nurse Practitioner  [facility] [address], [place], [place] [address] P: [phone]    F: [phone]  03/30/2022, 10:54 AM

## 2022-03-30 NOTE — Progress Notes (Signed)
Pt discharged home. PIV removed. Pt belongings packed. Pt educated on all discharge information, with no additional questions at this time.   Robina Ade, RN

## 2022-03-30 NOTE — TOC Progression Note (Signed)
Transition of Care [facility]) - Progression Note    Patient Details  Name: Jeanette Bennett MRN: [id number] Date of Birth: 25-Jul-1950  [facility] CM/SW Contact  Lawerance Sabal, RN Phone Number: 03/30/2022, 10:09 AM  Clinical Narrative:    Printed out referral form for OP PT of preference and provided to patient. Patient instructed to call to schedule appointment. Advised her that Dr Lindalou Hose office can be of assistance as well if needed for referral if any problems arise.  [facility] Thermon Leyland, Vir Phone: [phone] (fax- [phone])   Expected Discharge Plan: OP Rehab Barriers to Discharge: Continued Medical Work up  Expected Discharge Plan and Services Expected Discharge Plan: OP Rehab   Discharge Planning Services: CM Consult Post Acute Care Choice: NA Living arrangements for the past 2 months: Single Family Home                 DME Arranged: 3-N-1, Patient refused services (pt reports she has needed DME at home) DME Agency: NA       HH Arranged: NA HH Agency: NA         Social Determinants of Health (SDOH) Interventions    Readmission Risk Interventions     No data to display

## 2022-03-30 NOTE — Discharge Instructions (Signed)
Wound Care Keep incision dry. If surgical wound is not draining, no dressing is necessary. Do not put any creams, lotions, or ointments on incision. Leave steri-strips on back.  They will fall off by themselves. You are fine to shower. Let water run over incision and pat dry.  Activity Walk each and every day, increasing distance each day. No lifting greater than 5 lbs.  Avoid excessive back motion. No driving for 2 weeks; may ride as a passenger locally.  Diet Resume your normal diet.   Return to Work Will be discussed at your follow up appointment.  Call Your Doctor If Any of These Occur Redness, drainage, or swelling at the wound.  Temperature greater than 101 degrees. Severe pain not relieved by pain medication. Incision starts to come apart.  Follow Up Appt Call [phone] today for appointment in 2 weeks if you don't already have one or for any problems.

## 2022-03-31 MED FILL — Heparin Sodium (Porcine) Inj 1000 Unit/ML: INTRAMUSCULAR | Qty: 30 | Status: AC

## 2022-03-31 MED FILL — Sodium Chloride IV Soln 0.9%: INTRAVENOUS | Qty: 2000 | Status: AC

## 2023-01-20 IMAGING — MR MR LUMBAR SPINE W/O CM
4 of 5 series · 25 of 48 positions shown · non-contrast
Comparison: None.

CLINICAL DATA: Bilateral lower extremity pain. Bilateral lumbar
radiculopathy.

EXAM:
MRI LUMBAR SPINE WITHOUT CONTRAST
TECHNIQUE: Multiplanar, multisequence MR imaging of the lumbar spine was
performed. No intravenous contrast was administered.

[Series 3: T2 · sagittal · 4.0mm · 0.53mm/px · 6 of 15 slices shown (1 of 2)]
[im 1/15]
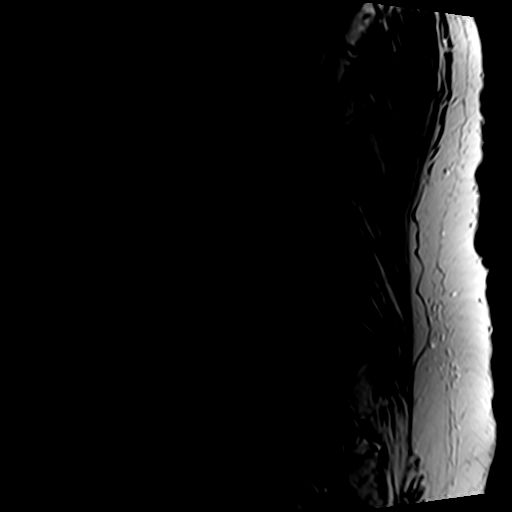
[im 3/15]
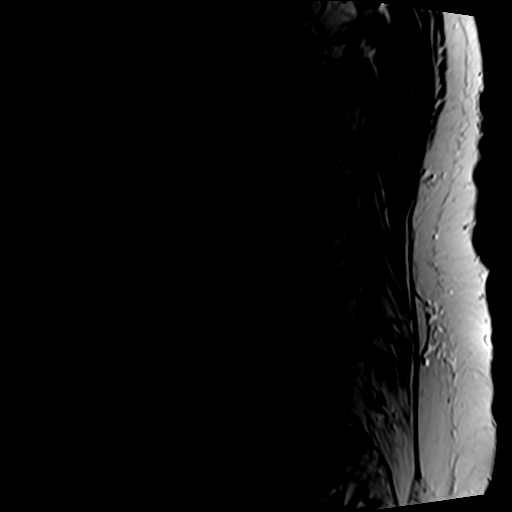
[im 6/15]
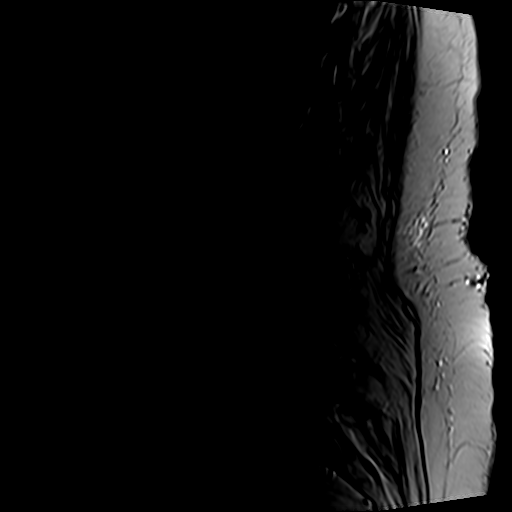
[im 9/15]
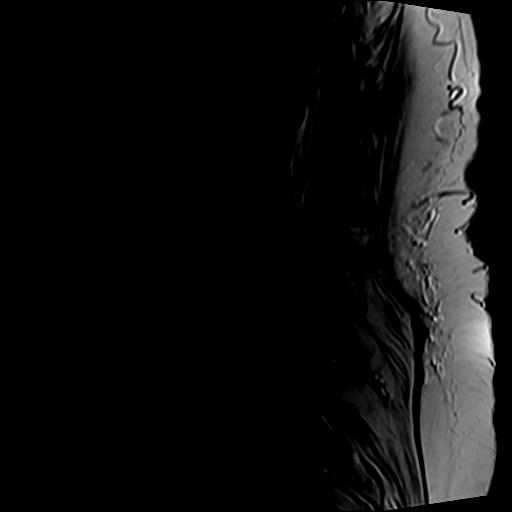
[im 12/15]
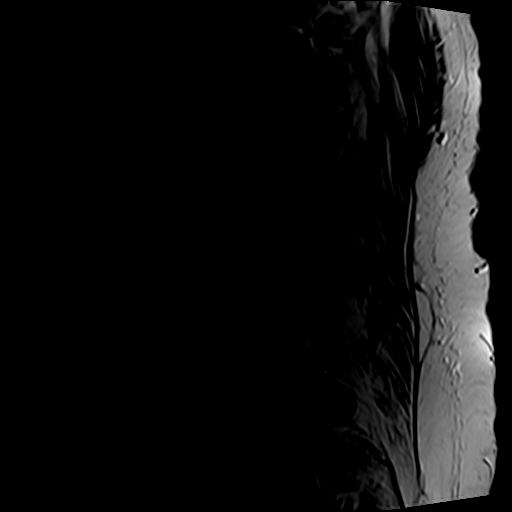
[im 15/15]
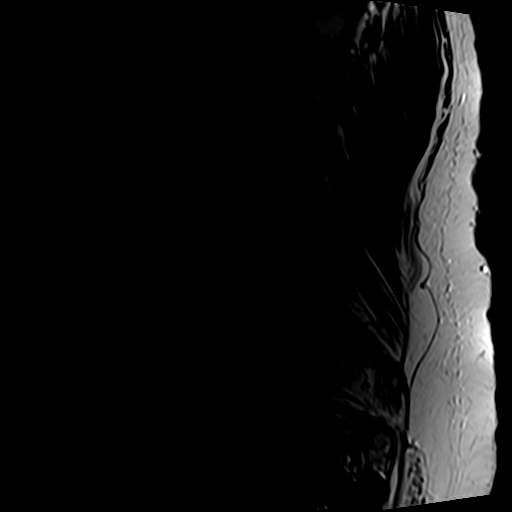

[Series 5: T1 · sagittal · 4.0mm · 0.53mm/px · 6 of 15 slices shown (1 of 2)]
[im 1/15]
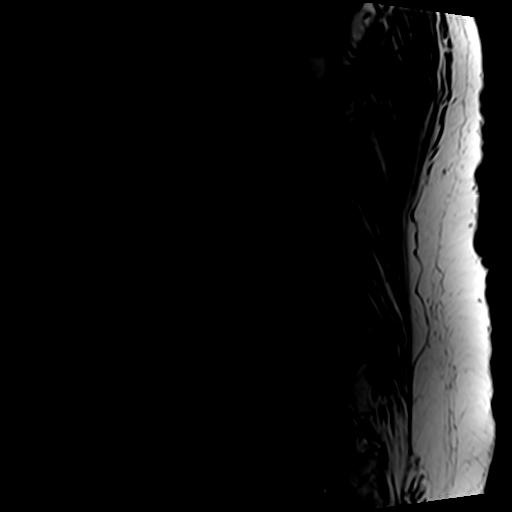
[im 3/15]
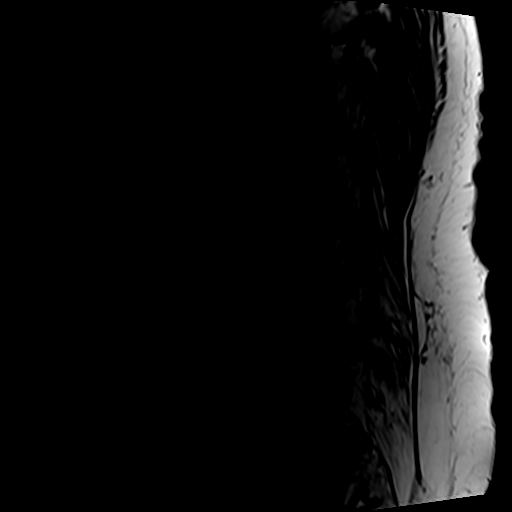
[im 6/15]
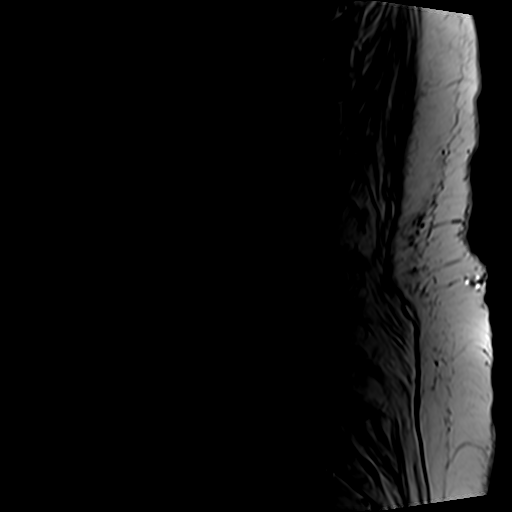
[im 9/15]
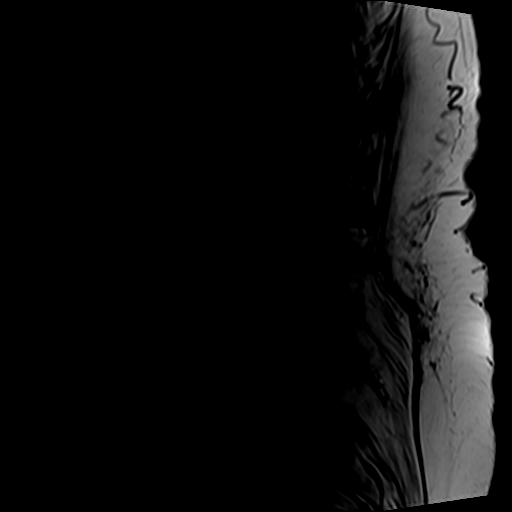
[im 12/15]
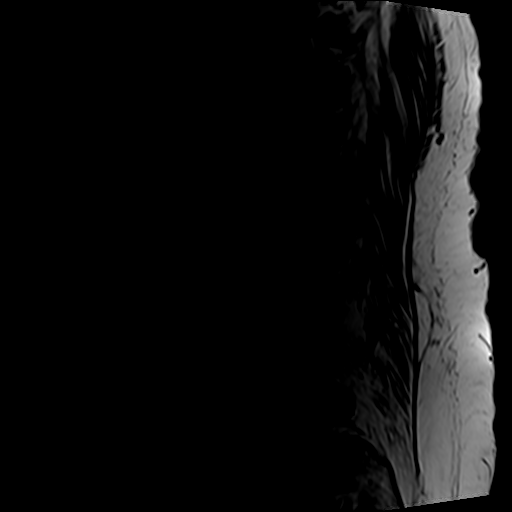
[im 15/15]
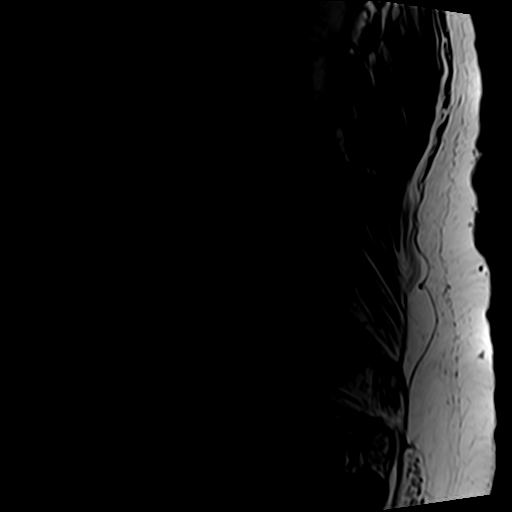

[Series 6: T2 · axial · 4.0mm · 0.70mm/px · z∈[-27,+169]mm · 9 of 36 slices shown (2 of 2)]
[im 1/36]
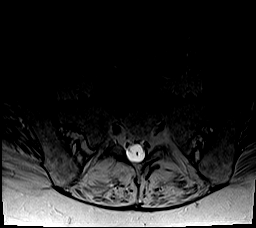
[im 6/36]
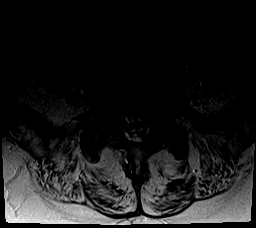
[im 11/36]
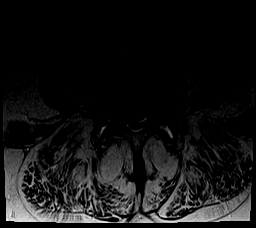
[im 16/36]
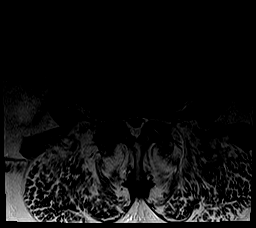
[im 18/36]
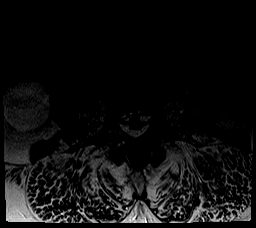
[im 21/36]
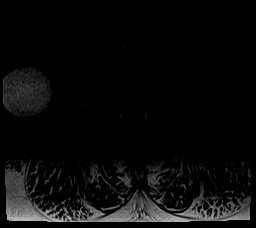
[im 26/36]
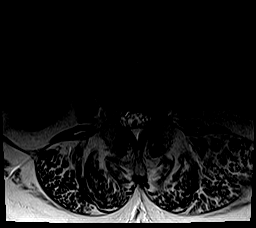
[im 31/36]
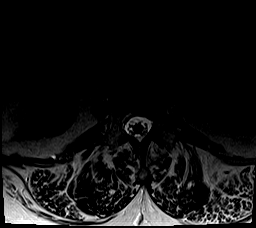
[im 36/36]
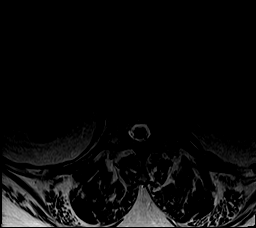

[Series 7: T1 · axial · 4.0mm · 0.35mm/px · z∈[-27,+143]mm · 4 of 36 slices shown (2 of 2)]
[im 1/36]
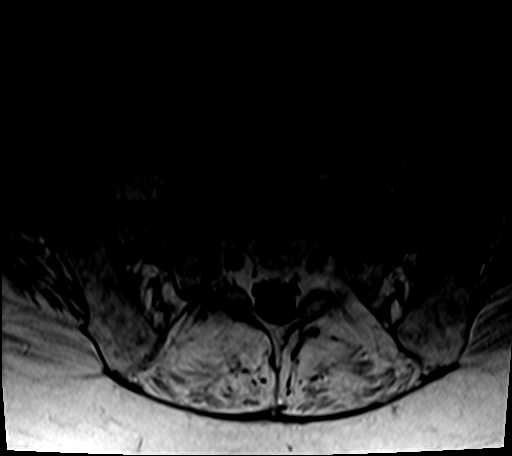
[im 6/36]
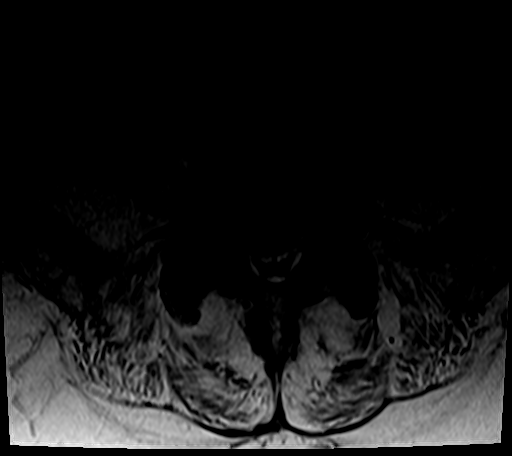
[im 18/36]
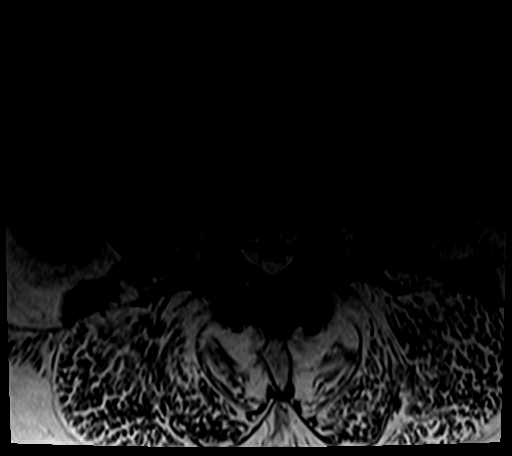
[im 31/36]
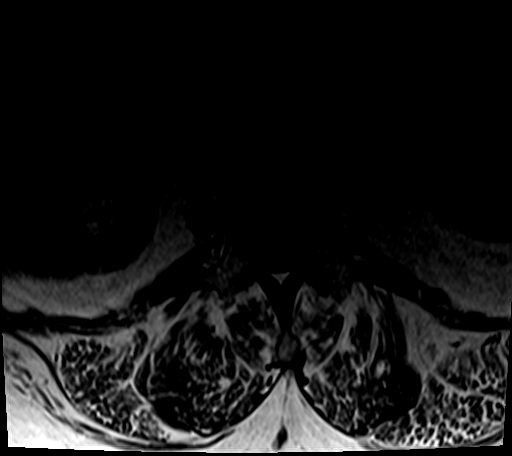

[25 of 48 positions shown; findings below may reference images not displayed]

FINDINGS: Segmentation:  Standard.

Alignment: Grade 1 retrolisthesis at L2-3 and grade 1
anterolisthesis at L4-5 and L5-S1.

Vertebrae:  No fracture, evidence of discitis, or bone lesion.

Conus medullaris and cauda equina: Conus extends to the L1 level.
Conus and cauda equina appear normal.

Paraspinal and other soft tissues:

4 cm simple right renal cyst.  No follow-up required.

Disc levels:

L1-L2: Small central disc protrusion is unchanged. No spinal canal
stenosis. No neural foraminal stenosis.

L2-L3: small disc bulge with moderate facet hypertrophy, unchanged.
Moderate spinal canal stenosis. Mild bilateral neural foraminal
stenosis.

L3-L4: Intermediate sized disc bulge with moderate facet
hypertrophy. Severe spinal canal stenosis. Moderate right and mild
left neural foraminal stenosis.

L4-L5: Unchanged large central disc extrusion with superior
migration and severe facet hypertrophy. Progression of severe spinal
canal stenosis. Unchanged mild right and severe left neural
foraminal stenosis.

L5-S1: Moderate facet hypertrophy with medium-sized disc bulge and
superimposed central protrusion, unchanged. Unchanged right lateral
recess narrowing without central spinal canal stenosis. Unchanged
severe bilateral neural foraminal stenosis.

Visualized sacrum: Normal.
IMPRESSION: 1. Progression of severe spinal canal stenosis at L4-L5 secondary to
large central disc extrusion and severe facet arthrosis.
2. Unchanged severe spinal canal stenosis at L3-L4 and moderate
spinal canal stenosis at L2-L3.
3. Unchanged severe bilateral L5-S1 and left L4-5 neural foraminal
stenosis.
